# Patient Record
Sex: Male | Born: 1970 | Race: Black or African American | Hispanic: No | Marital: Married | Smoking: Never smoker
Health system: Southern US, Community
[De-identification: ages and names within clinical notes are randomized; demographics above are authoritative.]

## PROBLEM LIST (undated history)

## (undated) DIAGNOSIS — I059 Rheumatic mitral valve disease, unspecified: Secondary | ICD-10-CM

## (undated) DIAGNOSIS — R011 Cardiac murmur, unspecified: Secondary | ICD-10-CM

## (undated) HISTORY — PX: MITRAL VALVE REPAIR: SHX2039

---

## 2007-04-02 ENCOUNTER — Ambulatory Visit: Payer: Self-pay | Admitting: Family Medicine

## 2007-10-09 ENCOUNTER — Ambulatory Visit: Payer: Self-pay | Admitting: Unknown Physician Specialty

## 2011-08-14 ENCOUNTER — Ambulatory Visit: Payer: Self-pay | Admitting: Internal Medicine

## 2014-11-15 DIAGNOSIS — I341 Nonrheumatic mitral (valve) prolapse: Secondary | ICD-10-CM | POA: Insufficient documentation

## 2014-11-18 DIAGNOSIS — E782 Mixed hyperlipidemia: Secondary | ICD-10-CM | POA: Insufficient documentation

## 2014-11-18 DIAGNOSIS — I340 Nonrheumatic mitral (valve) insufficiency: Secondary | ICD-10-CM | POA: Insufficient documentation

## 2015-03-01 ENCOUNTER — Encounter: Admission: RE | Payer: Self-pay | Source: Ambulatory Visit

## 2015-03-01 ENCOUNTER — Ambulatory Visit
Admission: RE | Admit: 2015-03-01 | Payer: BC Managed Care – PPO | Source: Ambulatory Visit | Admitting: Internal Medicine

## 2015-03-01 SURGERY — RIGHT/LEFT HEART CATH AND CORONARY ANGIOGRAPHY
Anesthesia: Moderate Sedation | Laterality: Bilateral

## 2015-03-01 SURGERY — ECHOCARDIOGRAM, TRANSESOPHAGEAL
Anesthesia: Moderate Sedation

## 2015-03-22 ENCOUNTER — Ambulatory Visit
Admission: RE | Admit: 2015-03-22 | Discharge: 2015-03-22 | Disposition: A | Discharge status: Home / Self Care | Payer: BC Managed Care – PPO | Source: Ambulatory Visit | Attending: Internal Medicine | Admitting: Internal Medicine

## 2015-03-22 ENCOUNTER — Encounter
Admission: RE | Disposition: A | Discharge status: Home / Self Care | Payer: Self-pay | Source: Ambulatory Visit | Attending: Internal Medicine

## 2015-03-22 ENCOUNTER — Encounter: Payer: Self-pay | Admitting: *Deleted

## 2015-03-22 DIAGNOSIS — I517 Cardiomegaly: Secondary | ICD-10-CM | POA: Insufficient documentation

## 2015-03-22 DIAGNOSIS — I340 Nonrheumatic mitral (valve) insufficiency: Secondary | ICD-10-CM | POA: Diagnosis present

## 2015-03-22 DIAGNOSIS — Z91011 Allergy to milk products: Secondary | ICD-10-CM | POA: Diagnosis not present

## 2015-03-22 DIAGNOSIS — I341 Nonrheumatic mitral (valve) prolapse: Secondary | ICD-10-CM | POA: Diagnosis not present

## 2015-03-22 HISTORY — DX: Cardiac murmur, unspecified: R01.1

## 2015-03-22 HISTORY — PX: CARDIAC CATHETERIZATION: SHX172

## 2015-03-22 HISTORY — PX: TEE WITHOUT CARDIOVERSION: SHX5443

## 2015-03-22 SURGERY — ECHOCARDIOGRAM, TRANSESOPHAGEAL
Anesthesia: Moderate Sedation

## 2015-03-22 SURGERY — RIGHT/LEFT HEART CATH AND CORONARY ANGIOGRAPHY
Anesthesia: Moderate Sedation

## 2015-03-22 MED ORDER — SODIUM CHLORIDE 0.9% FLUSH: 3.0000 mL | Freq: Two times a day (BID) | INTRAVENOUS | Status: DC

## 2015-03-22 MED ORDER — MIDAZOLAM HCL 5 MG/5ML IJ SOLN
INTRAMUSCULAR | Status: AC
  Administered 2015-03-22: 08:00:00
  Filled 2015-03-22: qty 5

## 2015-03-22 MED ORDER — BUTAMBEN-TETRACAINE-BENZOCAINE 2-2-14 % EX AERO
INHALATION_SPRAY | CUTANEOUS | Status: AC
  Filled 2015-03-22: qty 20

## 2015-03-22 MED ORDER — SODIUM CHLORIDE 0.9 % IV SOLN
INTRAVENOUS | Status: DC
  Administered 2015-03-22: 08:00:00 via INTRAVENOUS

## 2015-03-22 MED ORDER — ACETAMINOPHEN 325 MG PO TABS: 650.0000 mg | ORAL_TABLET | ORAL | Status: DC | PRN

## 2015-03-22 MED ORDER — ONDANSETRON HCL 4 MG/2ML IJ SOLN: 4.0000 mg | Freq: Four times a day (QID) | INTRAMUSCULAR | Status: DC | PRN

## 2015-03-22 MED ORDER — SODIUM CHLORIDE 0.9 % IV SOLN: 250.0000 mL | INTRAVENOUS | Status: DC | PRN

## 2015-03-22 MED ORDER — SODIUM CHLORIDE 0.9% FLUSH: 3.0000 mL | INTRAVENOUS | Status: DC | PRN

## 2015-03-22 MED ORDER — MIDAZOLAM HCL 2 MG/2ML IJ SOLN
INTRAMUSCULAR | Status: AC
  Administered 2015-03-22: 08:00:00
  Filled 2015-03-22: qty 2

## 2015-03-22 MED ORDER — SODIUM CHLORIDE FLUSH 0.9 % IV SOLN
INTRAVENOUS | Status: AC
  Filled 2015-03-22: qty 10

## 2015-03-22 MED ORDER — FENTANYL CITRATE (PF) 100 MCG/2ML IJ SOLN
INTRAMUSCULAR | Status: AC
  Administered 2015-03-22: 50 ug
  Filled 2015-03-22: qty 4

## 2015-03-22 MED ORDER — SODIUM CHLORIDE 0.9 % WEIGHT BASED INFUSION: 3.0000 mL/kg/h | INTRAVENOUS | Status: DC

## 2015-03-22 MED ORDER — IOHEXOL 300 MG/ML  SOLN
INTRAMUSCULAR | Status: DC | PRN
  Administered 2015-03-22: 130 mL via INTRA_ARTERIAL

## 2015-03-22 SURGICAL SUPPLY — 14 items
CATH INFINITI 5FR ANG PIGTAIL (CATHETERS) ×3 IMPLANT
CATH INFINITI 5FR JL4 (CATHETERS) ×3 IMPLANT
CATH INFINITI JR4 5F (CATHETERS) ×3 IMPLANT
CATH SWANZ 7F THERMO (CATHETERS) ×3 IMPLANT
DEVICE CLOSURE MYNXGRIP 5F (Vascular Products) ×2 IMPLANT
GUIDEWIRE EMER 3M J .025X150CM (WIRE) ×2 IMPLANT
KIT MANI 3VAL PERCEP (MISCELLANEOUS) ×3 IMPLANT
KIT RIGHT HEART (MISCELLANEOUS) ×3 IMPLANT
NDL PERC 18GX7CM (NEEDLE) ×1 IMPLANT
NEEDLE PERC 18GX7CM (NEEDLE) ×3 IMPLANT
PACK CARDIAC CATH (CUSTOM PROCEDURE TRAY) ×3 IMPLANT
SHEATH PINNACLE 5F 10CM (SHEATH) ×3 IMPLANT
SHEATH PINNACLE 7F 10CM (SHEATH) ×3 IMPLANT
WIRE EMERALD 3MM-J .035X150CM (WIRE) ×3 IMPLANT

## 2015-03-22 NOTE — Discharge Instructions (Signed)

## 2015-03-22 NOTE — Progress Notes (Signed)
*  PRELIMINARY RESULTS* Echocardiogram Echocardiogram Transesophageal has been performed.  Laqueta Jean Hege 03/22/2015, 8:46 AM

## 2015-07-10 DIAGNOSIS — Z9889 Other specified postprocedural states: Secondary | ICD-10-CM | POA: Insufficient documentation

## 2015-07-11 DIAGNOSIS — I440 Atrioventricular block, first degree: Secondary | ICD-10-CM | POA: Insufficient documentation

## 2015-08-27 ENCOUNTER — Encounter: Payer: Self-pay | Admitting: Emergency Medicine

## 2015-08-27 ENCOUNTER — Emergency Department
Admission: EM | Admit: 2015-08-27 | Discharge: 2015-08-27 | Disposition: A | Discharge status: Home / Self Care | Payer: BC Managed Care – PPO | Attending: Emergency Medicine | Admitting: Emergency Medicine

## 2015-08-27 ENCOUNTER — Emergency Department: Payer: BC Managed Care – PPO

## 2015-08-27 DIAGNOSIS — R0789 Other chest pain: Secondary | ICD-10-CM | POA: Diagnosis not present

## 2015-08-27 DIAGNOSIS — Z7982 Long term (current) use of aspirin: Secondary | ICD-10-CM | POA: Diagnosis not present

## 2015-08-27 DIAGNOSIS — R079 Chest pain, unspecified: Secondary | ICD-10-CM

## 2015-08-27 DIAGNOSIS — R0602 Shortness of breath: Secondary | ICD-10-CM | POA: Diagnosis present

## 2015-08-27 LAB — TROPONIN I: Troponin I: 0.03 ng/mL (ref <0.03)

## 2015-08-27 LAB — CBC
HEMATOCRIT: 40.5 % (ref 40.0–52.0)
HEMOGLOBIN: 13.5 g/dL (ref 13.0–18.0)
MCH: 28.6 pg (ref 26.0–34.0)
MCHC: 33.4 g/dL (ref 32.0–36.0)
MCV: 85.6 fL (ref 80.0–100.0)
Platelets: 203 10*3/uL (ref 150–440)
RBC: 4.73 MIL/uL (ref 4.40–5.90)
RDW: 14.9 % — ABNORMAL HIGH (ref 11.5–14.5)
WBC: 6.9 10*3/uL (ref 3.8–10.6)

## 2015-08-27 LAB — FIBRIN DERIVATIVES D-DIMER (ARMC ONLY): Fibrin derivatives D-dimer (ARMC): 5860 — ABNORMAL HIGH (ref 0–499)

## 2015-08-27 LAB — BASIC METABOLIC PANEL
ANION GAP: 8 (ref 5–15)
BUN: 14 mg/dL (ref 6–20)
CO2: 23 mmol/L (ref 22–32)
Calcium: 8.9 mg/dL (ref 8.9–10.3)
Chloride: 106 mmol/L (ref 101–111)
Creatinine, Ser: 1.21 mg/dL (ref 0.61–1.24)
GFR calc Af Amer: >60 mL/min (ref >60)
Glucose, Bld: 102 mg/dL — ABNORMAL HIGH (ref 65–99)
POTASSIUM: 3.8 mmol/L (ref 3.5–5.1)
SODIUM: 137 mmol/L (ref 135–145)

## 2015-08-27 LAB — BRAIN NATRIURETIC PEPTIDE: B Natriuretic Peptide: 58 pg/mL (ref 0.0–100.0)

## 2015-08-27 MED ORDER — IOPAMIDOL (ISOVUE-370) INJECTION 76%
75.0000 mL | Freq: Once | INTRAVENOUS | Status: AC | PRN
Start: 2015-08-27 — End: 2015-08-27
  Administered 2015-08-27: 75 mL via INTRAVENOUS

## 2015-08-27 MED ORDER — HYDROCODONE-ACETAMINOPHEN 5-325 MG PO TABS: 1.0000 | ORAL_TABLET | Freq: Four times a day (QID) | ORAL | 0 refills | Status: DC | PRN

## 2015-08-27 MED ORDER — AMOXICILLIN 500 MG PO CAPS: 500.0000 mg | ORAL_CAPSULE | Freq: Three times a day (TID) | ORAL | 0 refills | Status: AC

## 2015-08-27 NOTE — Discharge Instructions (Signed)
Please return immediately if condition worsens. Please contact her primary physician or the physician you were given for referral. If you have any specialist physicians involved in her treatment and plan please also contact them. Thank you for using [facility]. Please take over-the-counter ibuprofen and/or Tylenol for pain. Pain may be musculoskeletal in nature or from the pericardial fluid and also possibly the postoperative fluid it's most likely sitting at the bases of your lungs. Please contact both your cardiologist and the cardiothoracic surgeon for outpatient follow-up for today's presentation. I felt we did not need to necessarily do inpatient management ,but may need a echocardiogram etc.

## 2015-08-27 NOTE — ED Provider Notes (Signed)
Time Seen: Approximately 1730  I have reviewed the triage notes  Chief Complaint: Shortness of Breath and Chest Pain   History of Present Illness: Ricky Ross is a 45 y.o. male who said mitral valve repair approximately a month and a half ago. Patient states that he had an echocardiogram, etc. prior to his surgery. He's also had previous heart catheterization which didn't show any coronary artery disease. The patient presents with a 24-hour history of some left-sided upper chest pain which she states can get worse whenever he lies flat. He denies any true shortness of breath though he states it will hurt when he takes a deep breath and he feels heart palpitations like his breathing is being caught in the abdominal area. He denies much in way of flank or back discomfort. He denies any peripheral edema, calf tenderness or swelling. Patient states he did well postoperatively denies any fever, chills or productive cough or wheezing   Past Medical History:  Diagnosis Date  . Heart murmur     Patient Active Problem List   Diagnosis Date Noted  . Mitral valve prolapse, nonrheumatic 03/22/2015    Past Surgical History:  Procedure Laterality Date  . CARDIAC CATHETERIZATION N/A 03/22/2015   Procedure: Right/Left Heart Cath and Coronary Angiography;  Surgeon: Corey Skains, MD;  Location: [facility];  Service: Cardiovascular;  Laterality: N/A;  . MITRAL VALVE REPAIR    . TEE WITHOUT CARDIOVERSION N/A 03/22/2015   Procedure: TRANSESOPHAGEAL ECHOCARDIOGRAM (TEE);  Surgeon: Corey Skains, MD;  Location: [facility];  Service: Cardiovascular;  Laterality: N/A;    Past Surgical History:  Procedure Laterality Date  . CARDIAC CATHETERIZATION N/A 03/22/2015   Procedure: Right/Left Heart Cath and Coronary Angiography;  Surgeon: Corey Skains, MD;  Location: [facility];  Service: Cardiovascular;  Laterality: N/A;  . MITRAL VALVE REPAIR    . TEE WITHOUT CARDIOVERSION N/A  03/22/2015   Procedure: TRANSESOPHAGEAL ECHOCARDIOGRAM (TEE);  Surgeon: Corey Skains, MD;  Location: [facility];  Service: Cardiovascular;  Laterality: N/A;    Current Outpatient Rx  . Order #: [id number] Class: Historical Med  . Order #: [id number] Class: Print  . Order #: [id number] Class: Print    Allergies:  Milk-related compounds  Family History: No family history on file.  Social History: Social History  Substance Use Topics  . Smoking status: Never Smoker  . Smokeless tobacco: Not on file  . Alcohol use No     Review of Systems:   10 point review of systems was performed and was otherwise negative:  Constitutional: No fever Eyes: No visual disturbances ENT: No sore throat, ear pain Cardiac: Chest pain is very transient almost sharp and description. He denies any neck discomfort. He denies any diaphoresis, nausea, vomiting Respiratory: No shortness of breath, wheezing, or stridor Abdomen: No abdominal pain, no vomiting, No diarrhea Endocrine: No weight loss, No night sweats Extremities: No peripheral edema, cyanosis Skin: No rashes, easy bruising Neurologic: No focal weakness, trouble with speech or swollowing Urologic: No dysuria, Hematuria, or urinary frequency   Physical Exam:  ED Triage Vitals  Enc Vitals Group     BP 08/27/15 1652 113/73     Pulse Rate 08/27/15 1652 91     Resp 08/27/15 1652 18     Temp 08/27/15 1652 98.5 F (36.9 C)     Temp Source 08/27/15 1652 Oral     SpO2 08/27/15 1652 100 %     Weight 08/27/15 1652 198 lb (89.8  kg)     Height 08/27/15 1652 5\' 10"  (1.778 m)     Head Circumference --      Peak Flow --      Pain Score 08/27/15 1653 7     Pain Loc --      Pain Edu? --      Excl. in [place]? --     General: Awake , Alert , and Oriented times 3; GCS 15 Head: Normal cephalic , atraumatic Eyes: Pupils equal , round, reactive to light Nose/Throat: No nasal drainage, patent upper airway without erythema or exudate.  Neck: Supple, Full  range of motion, No anterior adenopathy or palpable thyroid masses Lungs: Clear to ascultation without wheezes , rhonchi, or rales Heart: Regular rate, regular rhythm without murmurs , gallops , or rubs Abdomen: Soft, non tender without rebound, guarding , or rigidity; bowel sounds positive and symmetric in all 4 quadrants. No organomegaly .        Extremities: 2 plus symmetric pulses. No edema, clubbing or cyanosis Neurologic: normal ambulation, Motor symmetric without deficits, sensory intact Skin: warm, dry, no rashes Mild reproducible component with palpation across the sternal region. No crepitus or step-off noted  Labs:   All laboratory work was reviewed including any pertinent negatives or positives listed below:  Labs Reviewed  Joice - Abnormal; Notable for the following:       Result Value   Glucose, Bld 102 (*)    All other components within normal limits  CBC - Abnormal; Notable for the following:    RDW 14.9 (*)    All other components within normal limits  TROPONIN I - Abnormal; Notable for the following:    Troponin I 0.03 (*)    All other components within normal limits  FIBRIN DERIVATIVES D-DIMER ([facility] ONLY) - Abnormal; Notable for the following:    Fibrin derivatives D-dimer [facility]) 5,860 (*)    All other components within normal limits  BRAIN NATRIURETIC PEPTIDE  Laboratory work showed an elevated D-dimer test.  EKG:  ED ECG REPORT I, Daymon Larsen, the attending physician, personally viewed and interpreted this ECG.  Date: 08/27/2015 EKG Time: 1651 Rate: *92 Rhythm: normal sinus rhythm QRS Axis: normal Intervals: normal ST/T Wave abnormalities: Nonspecific T wave abnormality Conduction Disturbances: none Narrative Interpretation: unremarkable No acute ischemic changes, no obvious findings of pericarditis    Radiology:CLINICAL DATA:  Chest pain and shortness of breath. Pain in left chest. Mitral valve repair 1 month ago. EXAM: CHEST   2 VIEW COMPARISON:  None. FINDINGS: The heart size is upper limits of normal. Mitral valve annular repair is noted. Bilateral pleural effusions are present. Moderate pulmonary vascular congestion is noted. Surgical clips are noted at the right hilum in the anterior right chest. The visualized soft tissues and bony thorax are otherwise unremarkable. IMPRESSION: 1. Bilateral pleural effusions and basilar airspace disease likely reflect atelectasis. This may reflect resolving effusions from recent open heart surgery. If these are recurrent, they may indicate congestive heart disease. It is impossible to snow without comparison films. 2. Postoperative changes in the right chest and mitral valve annular ring. Electronically Signed   By: San Morelle M.D.   On: 08/27/2015 17:35  EXAM: CT ANGIOGRAPHY CHEST WITH CONTRAST TECHNIQUE: Multidetector CT imaging of the chest was performed using the standard protocol during bolus administration of intravenous contrast. Multiplanar CT image reconstructions and MIPs were obtained to evaluate the vascular anatomy. CONTRAST:  75 cc Isovue 370 COMPARISON:  Chest radiograph  dated 08/27/2015 FINDINGS: There is a moderate right and small left pleural effusion. There is associated partial compressive atelectasis of the lung bases. Small amount of fluid is noted in the right minor fissure. There is subsegmental atelectatic changes of the right middle lobe. Pneumonia is not excluded. The central airways are patent. There is no pneumothorax. The thoracic aorta appears unremarkable. The origins of the great vessels of the aortic arch appear patent. There is dilatation of the main pulmonary trunk indicative of underlying pulmonary hypertension. Evaluation of the pulmonary arteries is limited due to suboptimal opacification of the peripheral branches. No central pulmonary artery embolus identified. There is moderate cardiomegaly and small  pericardial effusion. Mechanical mitral valve. There is diffuse stranding of the anterior mediastinal fat. No fluid collection. Multiple surgical clips noted in the anterior mediastinum extending into the anterior chest wall along the right side of the sternum. There is no mediastinal or hilar adenopathy. The esophagus is grossly unremarkable. There is no axillary adenopathy. There is subcutaneous haziness of the right anterior chest wall with multiple surgical clips, likely postsurgical changes. Correlation with history of surgery recommended. The osseous structures are intact. The visualized upper abdomen appear unremarkable. Review of the MIP images confirms the above findings. IMPRESSION: No central pulmonary artery embolus. Moderate right and small left pleural effusion with associated partial compressive atelectasis of the lower lobes. There is also subsegmental atelectatic changes of the right middle lobe. Pneumonia is not excluded. Moderate cardiomegaly with small pericardial effusion and small amount of fluid in the anterior mediastinum, likely postsurgical. Correlation with surgical history recommended. No drainable fluid collection or abscess or hematoma identified. Electronically Signed   By: Anner Crete M.D.   On: 08/27/2015 21:41 I personally reviewed the radiologic studies    ED Course:  Patient's stay here was uneventful and he declined on any pain medication while here in emergency department. It is difficult to ascertain the exact cause of his chest discomfort this time. He has some postoperative findings on EKG with what appears to be some left atrial enlargement and nonspecific ST-T wave abnormality. His chest CT did not show any evidence of significant. Cardial fluid or evidence of pulmonary embolism. Patient's pain seems to be very atypical in nature though still has residual small amount of pericardial fluid along with bilateral pleural effusions. I'm not  sure if these are normal postoperative findings as far out from his cardiothoracic surgery was some new process. Does not use but appear to be pulmonary edema and again he has no evidence of cardiac tamponade not etc. Patient was advised follow-up with both his cardiologist and/or his cardiothoracic surgeon. Because the radiologist couldn't rule out an infectious cause of place the patient on amoxicillin which I felt was a benign treatment plan and what appears to be clinically not pneumonia    Clinical Course     Assessment:  Acute unspecified chest pain Increasing cardiothoracic surgery on the mitral valve   Final Clinical Impression  Final diagnoses:  Chest pain  Chest pain, unspecified chest pain type     Plan: * Outpatient Prescription for Norco and amoxicillin Patient was advised to return immediately if condition worsens. Patient was advised to follow up with their primary care physician or other specialized physicians involved in their outpatient care. The patient and/or family member/power of attorney had laboratory results reviewed at the bedside. All questions and concerns were addressed and appropriate discharge instructions were distributed by the nursing staff.  Daymon Larsen, MD 08/27/15 [phone]

## 2015-08-27 NOTE — ED Triage Notes (Signed)
Pt states he became [facility] this morning after using his incentive spirometer this morning. Pt states he is unable to take a deep breath without pain. C/o pain to L chest under his ribs. Pt states hx mitral valve repair back in June. Pt also c/o constant back pain.

## 2015-08-27 NOTE — ED Notes (Signed)
Discharge instructions reviewed with patient. Patient verbalized understanding. Patient ambulated to lobby without difficulty.   

## 2015-08-27 NOTE — ED Notes (Signed)
Troponin 0.03. Md notified.

## 2015-09-05 ENCOUNTER — Emergency Department
Admission: EM | Admit: 2015-09-05 | Discharge: 2015-09-05 | Disposition: A | Discharge status: Home / Self Care | Payer: BC Managed Care – PPO | Attending: Emergency Medicine | Admitting: Emergency Medicine

## 2015-09-05 ENCOUNTER — Emergency Department: Payer: BC Managed Care – PPO

## 2015-09-05 DIAGNOSIS — Z7982 Long term (current) use of aspirin: Secondary | ICD-10-CM | POA: Diagnosis not present

## 2015-09-05 DIAGNOSIS — M7989 Other specified soft tissue disorders: Secondary | ICD-10-CM

## 2015-09-05 DIAGNOSIS — M79601 Pain in right arm: Secondary | ICD-10-CM | POA: Diagnosis present

## 2015-09-05 DIAGNOSIS — R600 Localized edema: Secondary | ICD-10-CM

## 2015-09-05 HISTORY — DX: Rheumatic mitral valve disease, unspecified: I05.9

## 2015-09-05 MED ORDER — OXYCODONE-ACETAMINOPHEN 5-325 MG PO TABS: 1.0000 | ORAL_TABLET | Freq: Four times a day (QID) | ORAL | 0 refills | Status: DC | PRN

## 2015-09-05 NOTE — Discharge Instructions (Signed)
Wear sling and take medication as directed.

## 2015-09-05 NOTE — ED Provider Notes (Signed)
[facility] Emergency Department Provider Note    ____________________________________________   First MD Initiated Contact with Patient 09/05/15 1028     (approximate)  I have reviewed the triage vital signs and the nursing notes.   HISTORY  Chief Complaint Arm Pain    HPI Ricky Ross is a 45 y.o. male patient here with right forearm pain and limited motion of the right arm secondary to infiltration IV contrast. Patient had a procedure on this July 30 which had ultrasound-guided IV. Patient stated he felt leaking and IV contrast was told to the IV was placed in an artery instead of a vein. Patient denies any redness to the affected extremity. Patient rated his pain as a 1/10.   Past Medical History:  Diagnosis Date  . Heart murmur   . Mitral valve disorder     Patient Active Problem List   Diagnosis Date Noted  . Mitral valve prolapse, nonrheumatic 03/22/2015    Past Surgical History:  Procedure Laterality Date  . CARDIAC CATHETERIZATION N/A 03/22/2015   Procedure: Right/Left Heart Cath and Coronary Angiography;  Surgeon: Corey Skains, MD;  Location: [place] [facility];  Service: Cardiovascular;  Laterality: N/A;  . MITRAL VALVE REPAIR    . TEE WITHOUT CARDIOVERSION N/A 03/22/2015   Procedure: TRANSESOPHAGEAL ECHOCARDIOGRAM (TEE);  Surgeon: Corey Skains, MD;  Location: [facility];  Service: Cardiovascular;  Laterality: N/A;    Prior to Admission medications   Medication Sig Start Date End Date Taking? Authorizing Provider  amoxicillin (AMOXIL) 500 MG capsule Take 1 capsule (500 mg total) by mouth 3 (three) times daily. 08/27/15 09/06/15  Daymon Larsen, MD  aspirin 81 MG EC tablet Take 81 mg by mouth daily.    Historical Provider, MD  HYDROcodone-acetaminophen (NORCO) 5-325 MG tablet Take 1 tablet by mouth every 6 (six) hours as needed for moderate pain. 08/27/15   Daymon Larsen, MD  oxyCODONE-acetaminophen (ROXICET) 5-325 MG tablet Take  1 tablet by mouth every 6 (six) hours as needed. 09/05/15 09/04/16  Sable Feil, PA-C    Allergies Milk-related compounds  No family history on file.  Social History Social History  Substance Use Topics  . Smoking status: Never Smoker  . Smokeless tobacco: Never Used  . Alcohol use No    Review of Systems Constitutional: No fever/chills Eyes: No visual changes. ENT: No sore throat. Cardiovascular: Denies chest pain. Respiratory: Denies shortness of breath. Gastrointestinal: No abdominal pain.  No nausea, no vomiting.  No diarrhea.  No constipation. Genitourinary: Negative for dysuria. Musculoskeletal: Right forearm pain Skin: Negative for rash. Right forearm edema Neurological: Negative for headaches, focal weakness or numbness.    ____________________________________________   PHYSICAL EXAM:  VITAL SIGNS: ED Triage Vitals  Enc Vitals Group     BP 09/05/15 0847 121/79     Pulse Rate 09/05/15 0847 91     Resp 09/05/15 0847 17     Temp 09/05/15 0847 97.8 F (36.6 C)     Temp src --      SpO2 09/05/15 0847 99 %     Weight 09/05/15 0847 200 lb (90.7 kg)     Height 09/05/15 0847 5\' 9"  (1.753 m)     Head Circumference --      Peak Flow --      Pain Score 09/05/15 0848 1     Pain Loc --      Pain Edu? --      Excl. in [place]? --  Constitutional: Alert and oriented. Well appearing and in no acute distress. Eyes: Conjunctivae are normal. PERRL. EOMI. Head: Atraumatic. Nose: No congestion/rhinnorhea. Mouth/Throat: Mucous membranes are moist.  Oropharynx non-erythematous. Neck: No stridor.  No cervical spine tenderness to palpation. Hematological/Lymphatic/Immunilogical: No cervical lymphadenopathy. Cardiovascular: Normal rate, regular rhythm. Grossly normal heart sounds.  Good peripheral circulation. Respiratory: Normal respiratory effort.  No retractions. Lungs CTAB. Gastrointestinal: Soft and nontender. No distention. No abdominal bruits. No CVA  tenderness. Musculoskeletal: No obvious deformity to the right upper extremity. There is some mild edema. Patient has full nuchal range of motion to complain of pain. Neurovascular intact.  Neurologic:  Normal speech and language. No gross focal neurologic deficits are appreciated. No gait instability. Skin:  Skin is warm, dry and intact. No rash noted. Psychiatric: Mood and affect are normal. Speech and behavior are normal.  ____________________________________________   LABS (all labs ordered are listed, but only abnormal results are displayed)  Labs Reviewed - No data to display ____________________________________________  EKG   ____________________________________________  RADIOLOGY ultrasound was negative for cellulitis, hematoma, or DVT. ____________________________________________   PROCEDURES  Procedure(s) performed: None  Procedures  Critical Care performed: No  ____________________________________________   INITIAL IMPRESSION / ASSESSMENT AND PLAN / ED COURSE  Pertinent labs & imaging results that were available during my care of the patient were reviewed by me and considered in my medical decision making (see chart for details).  Right forearm edema. Patient advised on negative findings of ultrasound the right upper extremity. Patient given discharge care instructions. Patient advised to wear sling to facilitate resolution of the edema. Patient advised to follow-up family doctor. Patient given a work note.  Clinical Course     ____________________________________________   FINAL CLINICAL IMPRESSION(S) / ED DIAGNOSES  Final diagnoses:  Right arm pain      NEW MEDICATIONS STARTED DURING THIS VISIT:  New Prescriptions   OXYCODONE-ACETAMINOPHEN (ROXICET) 5-325 MG TABLET    Take 1 tablet by mouth every 6 (six) hours as needed.     Note:  This document was prepared using Dragon voice recognition software and may include unintentional dictation  errors.    Sable Feil, PA-C 09/05/15 1227    Rudene Re, MD 09/05/15 [phone]

## 2015-09-05 NOTE — ED Notes (Signed)
See triage note  Was seen here on 07/30 ..states he had an ultrasound guided IV placed in right arm for chest ct  States during the scan  He felt leaking of the iv . Stopped the scan   Was told that the IV was placed in artery instead  conts to have pain with limited movem\ent of right arm

## 2015-09-05 NOTE — ED Triage Notes (Signed)
Pt was here 7/30 and had a Ct with iv contrast, states the IV site infiltrated in there right FA and is here with c/o pain with swelling to the the site.. No noted redness.. Some bruising noted.Marland Kitchen

## 2016-01-31 DIAGNOSIS — I872 Venous insufficiency (chronic) (peripheral): Secondary | ICD-10-CM | POA: Insufficient documentation

## 2016-03-07 ENCOUNTER — Other Ambulatory Visit (INDEPENDENT_AMBULATORY_CARE_PROVIDER_SITE_OTHER): Payer: Self-pay | Admitting: Internal Medicine

## 2016-03-07 ENCOUNTER — Ambulatory Visit (INDEPENDENT_AMBULATORY_CARE_PROVIDER_SITE_OTHER): Payer: BC Managed Care – PPO | Admitting: Vascular Surgery

## 2016-03-07 ENCOUNTER — Other Ambulatory Visit (INDEPENDENT_AMBULATORY_CARE_PROVIDER_SITE_OTHER): Payer: BC Managed Care – PPO

## 2016-03-07 ENCOUNTER — Encounter (INDEPENDENT_AMBULATORY_CARE_PROVIDER_SITE_OTHER): Payer: Self-pay

## 2016-03-07 VITALS — BP 131/82 | HR 79 | Resp 16 | Ht 69.5 in | Wt 214.0 lb

## 2016-03-07 DIAGNOSIS — I341 Nonrheumatic mitral (valve) prolapse: Secondary | ICD-10-CM | POA: Diagnosis not present

## 2016-03-07 DIAGNOSIS — I890 Lymphedema, not elsewhere classified: Secondary | ICD-10-CM | POA: Insufficient documentation

## 2016-03-07 DIAGNOSIS — I872 Venous insufficiency (chronic) (peripheral): Secondary | ICD-10-CM

## 2016-03-07 NOTE — Progress Notes (Signed)
MRN : [id number]  Ricky Ross is a 46 y.o. ([birth year]) male who presents with chief complaint of  Chief Complaint  Patient presents with  . New Evaluation    Reflux Left leg  .  History of Present Illness: Patient is seen for evaluation of leg pain and leg swelling. The patient first noticed the swelling remotely. The swelling is associated with pain and discoloration. The pain and swelling worsens with prolonged dependency and improves with elevation. The pain is unrelated to activity.  The patient notes that in the morning the legs are significantly improved but they steadily worsened throughout the course of the day. The patient also notes a steady worsening of the discoloration in the ankle and shin area.   The patient denies claudication symptoms.  The patient denies symptoms consistent with rest pain.  The patient denies and extensive history of DJD and LS spine disease.  The patient has no had any past angiography, interventions or vascular surgery.  Elevation makes the leg symptoms better, dependency makes them much worse. There is no history of ulcerations. The patient denies any recent changes in medications.  The patient has not been wearing graduated compression.  The patient denies a history of DVT or PE. There is no prior history of phlebitis. There is no history of primary lymphedema.  No history of malignancies. No history of trauma or groin or pelvic surgery. There is no history of radiation treatment to the groin or pelvis  The patient denies amaurosis fugax or recent TIA symptoms. There are no recent neurological changes noted. The patient denies recent episodes of angina or shortness of breath  Current Meds  Medication Sig  . aspirin 81 MG EC tablet Take 81 mg by mouth daily.  . Multiple Vitamin (MULTI-VITAMINS) TABS Take by mouth.    Past Medical History:  Diagnosis Date  . Heart murmur   . Mitral valve disorder     Past Surgical History:  Procedure  Laterality Date  . CARDIAC CATHETERIZATION N/A 03/22/2015   Procedure: Right/Left Heart Cath and Coronary Angiography;  Surgeon: Corey Skains, MD;  Location: [facility];  Service: Cardiovascular;  Laterality: N/A;  . MITRAL VALVE REPAIR    . TEE WITHOUT CARDIOVERSION N/A 03/22/2015   Procedure: TRANSESOPHAGEAL ECHOCARDIOGRAM (TEE);  Surgeon: Corey Skains, MD;  Location: [facility];  Service: Cardiovascular;  Laterality: N/A;    Social History Social History  Substance Use Topics  . Smoking status: Never Smoker  . Smokeless tobacco: Never Used  . Alcohol use No    Family History No family history on file. No family history of bleeding/clotting disorders, porphyria or autoimmune disease   Allergies  Allergen Reactions  . Milk-Related Compounds Hives     REVIEW OF SYSTEMS (Negative unless checked)  Constitutional: [] Weight loss  [] Fever  [] Chills Cardiac: [] Chest pain   [] Chest pressure   [] Palpitations   [] Shortness of breath when laying flat   [] Shortness of breath with exertion. Vascular:  [] Pain in legs with walking   [] Pain in legs at rest  [] History of DVT   [] Phlebitis   [x] Swelling in legs   [x] Varicose veins   [] Non-healing ulcers Pulmonary:   [] Uses home oxygen   [] Productive cough   [] Hemoptysis   [] Wheeze  [] COPD   [] Asthma Neurologic:  [] Dizziness   [] Seizures   [] History of stroke   [] History of TIA  [] Aphasia   [] Vissual changes   [] Weakness or numbness in arm   [] Weakness or  numbness in leg Musculoskeletal:   [] Joint swelling   [x] Joint pain   [] Low back pain Hematologic:  [] Easy bruising  [] Easy bleeding   [] Hypercoagulable state   [] Anemic Gastrointestinal:  [] Diarrhea   [] Vomiting  [] Gastroesophageal reflux/heartburn   [] Difficulty swallowing. Genitourinary:  [] Chronic kidney disease   [] Difficult urination  [] Frequent urination   [] Blood in urine Skin:  [] Rashes   [] Ulcers  Psychological:  [] History of anxiety   []  History of major  depression.  Physical Examination  Vitals:   03/07/16 1440  BP: 131/82  Pulse: 79  Resp: 16  Weight: 214 lb (97.1 kg)  Height: 5' 9.5" (1.765 m)   Body mass index is 31.15 kg/m. Gen: WD/WN, NAD Head: [place]/AT, No temporalis wasting.  Ear/Nose/Throat: Hearing grossly intact, nares w/o erythema or drainage, poor dentition Eyes: PER, EOMI, sclera nonicteric.  Neck: Supple, no masses.  No bruit or JVD.  Pulmonary:  Good air movement, clear to auscultation bilaterally, no use of accessory muscles.  Cardiac: RRR, normal S1, S2, no Murmurs. Vascular:  Edema of the left ankle 2+ Vessel Right Left  Radial Palpable Palpable  Ulnar Palpable Palpable  Brachial Palpable Palpable  Carotid Palpable Palpable  Femoral Palpable Palpable  Popliteal Palpable Palpable  PT Palpable Palpable  DP Palpable Palpable   Gastrointestinal: soft, non-distended. No guarding/no peritoneal signs.  Musculoskeletal: M/S 5/5 throughout.  No deformity or atrophy.  Neurologic: CN 2-12 intact. Pain and light touch intact in extremities.  Symmetrical.  Speech is fluent. Motor exam as listed above. Psychiatric: Judgment intact, Mood & affect appropriate for pt's clinical situation. Dermatologic: No rashes or ulcers noted.  No changes consistent with cellulitis. Lymph : No Cervical lymphadenopathy, no lichenification or skin changes of chronic lymphedema.  CBC Lab Results  Component Value Date   WBC 6.9 08/27/2015   HGB 13.5 08/27/2015   HCT 40.5 08/27/2015   MCV 85.6 08/27/2015   PLT 203 08/27/2015    BMET    Component Value Date/Time   NA 137 08/27/2015 1656   K 3.8 08/27/2015 1656   CL 106 08/27/2015 1656   CO2 23 08/27/2015 1656   GLUCOSE 102 (H) 08/27/2015 1656   BUN 14 08/27/2015 1656   CREATININE 1.21 08/27/2015 1656   CALCIUM 8.9 08/27/2015 1656   GFRNONAA >60 08/27/2015 1656   GFRAA >60 08/27/2015 1656   CrCl cannot be calculated (Patient's most recent lab result is older than the maximum  21 days allowed.).  COAG No results found for: INR, PROTIME  Radiology No results found.  Assessment/Plan 1. Chronic venous insufficiency No surgery or intervention at this point in time.    I have had a long discussion with the patient regarding venous insufficiency and why it  causes symptoms. I have discussed with the patient the chronic skin changes that accompany venous insufficiency and the long term sequela such as infection and ulceration.  Patient will begin wearing graduated compression stockings class 1 (20-30 mmHg) or compression wraps on a daily basis a prescription was given. The patient will put the stockings on first thing in the morning and removing them in the evening. The patient is instructed specifically not to sleep in the stockings.    In addition, behavioral modification including several periods of elevation of the lower extremities during the day will be continued. I have demonstrated that proper elevation is a position with the ankles at heart level.  The patient is instructed to begin routine exercise, especially walking on a daily basis  Patient's duplex ultrasound of the venous system shows no evidence of  DVT;  reflux is present in the left common femoral vein.  Following the review of the ultrasound the patient will follow up in 12 months to reassess the degree of swelling and the control that graduated compression stockings or compression wraps  is offering.   The patient can be assessed for a Lymph Pump at that time  2. Lymphedema See #1  3. Mitral valve prolapse, nonrheumatic Cardiology is following    Hortencia Pilar, MD  03/07/2016 3:56 PM

## 2016-11-26 DIAGNOSIS — I300 Acute nonspecific idiopathic pericarditis: Secondary | ICD-10-CM | POA: Insufficient documentation

## 2016-11-27 ENCOUNTER — Ambulatory Visit: Payer: BC Managed Care – PPO | Admitting: Gastroenterology

## 2016-12-24 ENCOUNTER — Ambulatory Visit: Payer: BC Managed Care – PPO | Admitting: Gastroenterology

## 2017-01-10 ENCOUNTER — Encounter: Payer: Self-pay | Admitting: Internal Medicine

## 2017-01-10 ENCOUNTER — Ambulatory Visit (INDEPENDENT_AMBULATORY_CARE_PROVIDER_SITE_OTHER): Payer: BC Managed Care – PPO | Admitting: Internal Medicine

## 2017-01-10 VITALS — BP 120/75 | HR 80 | Resp 16 | Ht 69.0 in | Wt 221.0 lb

## 2017-01-10 DIAGNOSIS — J3089 Other allergic rhinitis: Secondary | ICD-10-CM

## 2017-01-10 DIAGNOSIS — R05 Cough: Secondary | ICD-10-CM | POA: Diagnosis not present

## 2017-01-10 DIAGNOSIS — I341 Nonrheumatic mitral (valve) prolapse: Secondary | ICD-10-CM

## 2017-01-10 DIAGNOSIS — G479 Sleep disorder, unspecified: Secondary | ICD-10-CM

## 2017-01-10 DIAGNOSIS — R059 Cough, unspecified: Secondary | ICD-10-CM

## 2017-02-03 NOTE — Progress Notes (Signed)
  [facility] [place], [place] [address]  Internal MEDICINE  Office Visit Note  Patient Name: Ricky Ross  [id number]  [id number]  Date of Service: 02/03/2017     Complaints/HPI:  Pt is here for a sick visit.  Cough  This is a new problem. The current episode started in the past 7 days. The problem has been gradually improving. The cough is non-productive. Associated symptoms include rhinorrhea and a sore throat. Pertinent negatives include no chills, ear congestion, ear pain or fever. Nothing aggravates the symptoms. He has tried OTC cough suppressant for the symptoms. The treatment provided mild relief.  Pt is concerned about sleep problems   Current Medication:  Outpatient Encounter Medications as of 01/10/2017  Medication Sig  . [EXPIRED] amoxicillin-clavulanate (AUGMENTIN) 875-125 MG tablet Take 1 tablet by mouth 2 (two) times daily.  Marland Kitchen aspirin 81 MG EC tablet Take 81 mg by mouth daily.  . Multiple Vitamin (MULTI-VITAMINS) TABS Take by mouth.   No facility-administered encounter medications on file as of 01/10/2017.       Medical History: Past Medical History:  Diagnosis Date  . Heart murmur   . Mitral valve disorder      Vital Signs: BP 120/75   Pulse 80   Resp 16   Ht 5\' 9"  (1.753 m)   Wt 221 lb (100.2 kg)   SpO2 98%   BMI 32.64 kg/m    Review of Systems  Constitutional: Negative for chills and fever.  HENT: Positive for rhinorrhea and sore throat. Negative for ear pain.   Respiratory: Positive for cough.     Physical Exam  Constitutional: He is oriented to person, place, and time. He appears well-developed and well-nourished. No distress.  HENT:  Head: Normocephalic and atraumatic.  Mouth/Throat: Oropharynx is clear and moist. No oropharyngeal exudate.  Eyes: EOM are normal. Pupils are equal, round, and reactive to light.  Neck: Normal range of motion. Neck supple. No JVD present. No tracheal deviation present. No  thyromegaly present.  Cardiovascular: Normal rate and regular rhythm. Exam reveals no gallop and no friction rub.  Murmur heard. Pulmonary/Chest: Effort normal. No respiratory distress. He has no wheezes. He has no rales. He exhibits no tenderness.  Abdominal: Soft. Bowel sounds are normal.  Musculoskeletal: Normal range of motion.  Lymphadenopathy:    He has no cervical adenopathy.  Neurological: He is alert and oriented to person, place, and time. No cranial nerve deficit.  Skin: Skin is warm and dry. He is not diaphoretic.  Psychiatric: He has a normal mood and affect. His behavior is normal. Judgment and thought content normal.      Assessment/Plan: 1. Non-seasonal allergic rhinitis due to other allergic trigger - OTC flonase prn, symptoms are resolving  2. Cough in adult - Monitor for now   3. Mitral valve prolapse, nonrheumatic S/p surgical intervention  4. Sleep disorder  - PSG SLEEP STUDY; Future  General Counseling : I have discussed the findings of the evaluation and examination with Ricky Ross.  I have also discussed any further diagnostic evaluation that may be needed or ordered today. Ricky Ross verbalizes understanding of the findings of todays visit. We also reviewed his medications today. he has been encouraged to call the office with any questions or concerns that should arise related to todays visit.    Counseling: Sleep hygiene is discussed with the pt      Time spent: 25 min

## 2017-03-10 ENCOUNTER — Ambulatory Visit (INDEPENDENT_AMBULATORY_CARE_PROVIDER_SITE_OTHER): Payer: BC Managed Care – PPO | Admitting: Vascular Surgery

## 2017-08-25 ENCOUNTER — Encounter: Payer: Self-pay | Admitting: Adult Health

## 2017-08-25 ENCOUNTER — Ambulatory Visit (INDEPENDENT_AMBULATORY_CARE_PROVIDER_SITE_OTHER): Payer: BC Managed Care – PPO | Admitting: Adult Health

## 2017-08-25 VITALS — BP 132/86 | HR 68 | Temp 98.3°F | Resp 16 | Ht 70.0 in | Wt 223.2 lb

## 2017-08-25 DIAGNOSIS — Z87898 Personal history of other specified conditions: Secondary | ICD-10-CM | POA: Diagnosis not present

## 2017-08-25 DIAGNOSIS — R0681 Apnea, not elsewhere classified: Secondary | ICD-10-CM | POA: Diagnosis not present

## 2017-08-25 DIAGNOSIS — I341 Nonrheumatic mitral (valve) prolapse: Secondary | ICD-10-CM | POA: Diagnosis not present

## 2017-08-25 DIAGNOSIS — G479 Sleep disorder, unspecified: Secondary | ICD-10-CM

## 2017-08-25 NOTE — Progress Notes (Signed)
[facility] [place], [place] [address]  Internal MEDICINE  Office Visit Note  Patient Name: Ricky Ross  [id number]  [id number]  Date of Service: 08/25/2017  Chief Complaint  Patient presents with  . Sleep Apnea    wife complaining about snoring     HPI Pt here reporting his wife has noticed excessive snoring at night with periods of apnea.  He denies excessive daytime sleepiness, however at times he reports feeling fatigued and tired even after sleeping. He exercises regularly, and works 5 days a week.  Denies history of smoking.        Current Medication: Outpatient Encounter Medications as of 08/25/2017  Medication Sig  . aspirin 81 MG EC tablet Take 81 mg by mouth daily.  . Multiple Vitamin (MULTI-VITAMINS) TABS Take by mouth.   No facility-administered encounter medications on file as of 08/25/2017.     Surgical History: Past Surgical History:  Procedure Laterality Date  . CARDIAC CATHETERIZATION N/A 03/22/2015   Procedure: Right/Left Heart Cath and Coronary Angiography;  Surgeon: Corey Skains, MD;  Location: [place];  Service: Cardiovascular;  Laterality: N/A;  . MITRAL VALVE REPAIR    . TEE WITHOUT CARDIOVERSION N/A 03/22/2015   Procedure: TRANSESOPHAGEAL ECHOCARDIOGRAM (TEE);  Surgeon: Corey Skains, MD;  Location: [facility];  Service: Cardiovascular;  Laterality: N/A;    Medical History: Past Medical History:  Diagnosis Date  . Heart murmur   . Mitral valve disorder     Family History: Family History  Family history unknown: Yes    Social History   Socioeconomic History  . Marital status: Married    Spouse name: Not on file  . Number of children: Not on file  . Years of education: Not on file  . Highest education level: Not on file  Occupational History  . Not on file  Social Needs  . Financial resource strain: Not on file  . Food insecurity:    Worry: Not on file    Inability: Not on file  .  Transportation needs:    Medical: Not on file    Non-medical: Not on file  Tobacco Use  . Smoking status: Never Smoker  . Smokeless tobacco: Never Used  Substance and Sexual Activity  . Alcohol use: No  . Drug use: No  . Sexual activity: Not on file  Lifestyle  . Physical activity:    Days per week: Not on file    Minutes per session: Not on file  . Stress: Not on file  Relationships  . Social connections:    Talks on phone: Not on file    Gets together: Not on file    Attends religious service: Not on file    Active member of club or organization: Not on file    Attends meetings of clubs or organizations: Not on file    Relationship status: Not on file  . Intimate partner violence:    Fear of current or ex partner: Not on file    Emotionally abused: Not on file    Physically abused: Not on file    Forced sexual activity: Not on file  Other Topics Concern  . Not on file  Social History Narrative  . Not on file      Review of Systems  Constitutional: Negative.  Negative for chills, fatigue and unexpected weight change.  HENT: Negative.  Negative for congestion, rhinorrhea, sneezing and sore throat.   Eyes: Negative for redness.  Respiratory: Negative.  Negative for cough, chest tightness and shortness of breath.   Cardiovascular: Negative.  Negative for chest pain and palpitations.  Gastrointestinal: Negative.  Negative for abdominal pain, constipation, diarrhea, nausea and vomiting.  Endocrine: Negative.   Genitourinary: Negative.  Negative for dysuria and frequency.  Musculoskeletal: Negative.  Negative for arthralgias, back pain, joint swelling and neck pain.  Skin: Negative.  Negative for rash.  Allergic/Immunologic: Negative.   Neurological: Negative.  Negative for tremors and numbness.  Hematological: Negative for adenopathy. Does not bruise/bleed easily.  Psychiatric/Behavioral: Negative.  Negative for behavioral problems, sleep disturbance and suicidal ideas.  The patient is not nervous/anxious.     Vital Signs: BP 132/86   Pulse 68   Temp 98.3 F (36.8 C)   Resp 16   Ht 5\' 10"  (1.778 m)   Wt 223 lb 3.2 oz (101.2 kg)   SpO2 98%   BMI 32.03 kg/m    Physical Exam  Constitutional: He is oriented to person, place, and time. He appears well-developed and well-nourished. No distress.  HENT:  Head: Normocephalic and atraumatic.  Mouth/Throat: Oropharynx is clear and moist. No oropharyngeal exudate.  Eyes: Pupils are equal, round, and reactive to light. EOM are normal.  Neck: Normal range of motion. Neck supple. No JVD present. No tracheal deviation present. No thyromegaly present.  Cardiovascular: Normal rate, regular rhythm and normal heart sounds. Exam reveals no gallop and no friction rub.  No murmur heard. Pulmonary/Chest: Effort normal and breath sounds normal. No respiratory distress. He has no wheezes. He has no rales. He exhibits no tenderness.  Abdominal: Soft. There is no tenderness. There is no guarding.  Musculoskeletal: Normal range of motion.  Lymphadenopathy:    He has no cervical adenopathy.  Neurological: He is alert and oriented to person, place, and time. No cranial nerve deficit.  Skin: Skin is warm and dry. He is not diaphoretic.  Psychiatric: He has a normal mood and affect. His behavior is normal. Judgment and thought content normal.  Nursing note and vitals reviewed.  Assessment/Plan: 1. Sleep disorder Excessive snoring, Apnea episodes, and incrased bmi.  Get sleep study, and follow up in 3 weeks for results.   2. Mitral valve prolapse, nonrheumatic Continue to be followed by cardiology.   3. History of snoring - Home sleep test  4. Apnea - Home sleep test  General Counseling: Efren verbalizes understanding of the findings of todays visit and agrees with plan of treatment. I have discussed any further diagnostic evaluation that may be needed or ordered today. We also reviewed his medications today. he has  been encouraged to call the office with any questions or concerns that should arise related to todays visit.    No orders of the defined types were placed in this encounter.   No orders of the defined types were placed in this encounter.   Time spent: 25 Minutes   This patient was seen by Orson Gear AGNP-C in Collaboration with Dr Lavera Guise as a part of collaborative care agreement    Dr Lavera Guise Internal medicine

## 2017-08-25 NOTE — Patient Instructions (Signed)

## 2017-09-01 ENCOUNTER — Encounter: Payer: Self-pay | Admitting: Adult Health

## 2017-09-03 ENCOUNTER — Other Ambulatory Visit: Payer: BC Managed Care – PPO | Admitting: Internal Medicine

## 2017-09-09 ENCOUNTER — Ambulatory Visit: Payer: Self-pay | Admitting: Internal Medicine

## 2017-09-11 ENCOUNTER — Encounter (INDEPENDENT_AMBULATORY_CARE_PROVIDER_SITE_OTHER): Payer: BC Managed Care – PPO | Admitting: Internal Medicine

## 2017-09-11 DIAGNOSIS — G4710 Hypersomnia, unspecified: Secondary | ICD-10-CM | POA: Diagnosis not present

## 2017-09-11 NOTE — Procedures (Signed)
[facility] [place], [place] [address]  Sleep Specialist: Allyne Gee, MD [facility] Sleep Study Interpretation  Patient Name: Ricky Ross Patient MR [id number] DOB:01/07/1971  Date of Study:   September 04, 2017  Indications for study:  Hypersomnia sleep apnea  BMI:  32 kilogram/meters squared       Respiratory Data:  Total AHI:  2.4 per hour and respiratory index is 5.4 per hour  Total Obstructive Apneas:  0  Total Central Apneas:  0  Total Mixed Apneas:  0  Total Hypopneas:  17  If the AHI is greater than 5 per hour patient qualifies for PAP evaluation  Oximetry Data:  Oxygen Desaturation Index: 4.8 per hour  Lowest Desaturation:  84%  Cardiac Data:  Minimum Heart Rate:  50  Maximum Heart Rate:  94   Impression / Diagnosis:   this home length study shows that the apnea hypopnea index is within normal limits.  The respiratory disturbance index however is 5.4 and is elevated.  This patient may not qualify for CPAP however appears to have significant upper airway disturbance for further assessment.  In addition patient did have brief desaturation and may benefit from getting a overnight oximetry at home.  The increase in respiratory disturbance index can be evaluated by ENT upper airways evaluation.  GENERAL Recommendations:  1.  Consider Auto PAP with pressure ranges 5-20 cmH20 with download, or facility based PAP Titration Study  2.  Consider PAP interface mask fitted for patient comfort, Heated Humidification & PAP compliance monitoring (1 month, 3 months & 12 months after PAP initiation)  3. Consider treatment with mandibular advancement splint (MAS) or referral to an ENT surgeon for modification to the upper airway if the patient prefers an alternate therapy or the PAP trial is unsuccessful  4. Sleep hygiene measures should be discussed with the patient  5. Behavioral therapy such as weight reduction or smoking cessation  as appropriate for the patient  6. Advise patient against the use of alcohol or sedatives in so much as these substances can worsen excessive daytime sleepiness and respiratory disturbances of sleep  7. Advise patient against participating in potentially dangerous activities while drowsy such as operating a motor vehicle, heavy equipment or power tools as it can put them and others in danger  8. Advise patient of the long term consequences of OSA if left untreated, need for treatment and close follow up  9. Clinical follow up as deemed necessary     This Level III home sleep study was performed using the [employer], a 4 channel screening device subject to limitations. Depending on actual total sleep time, not measured in this study, the AHI (sum of apneas and hypopneas/hr of sleep) and therefore the severity of sleep apnea may be underestimated. As with any single night study, including Level 1 attended PSG, severity of sleep apnea may also be underestimated due to the lack of supine and/or REM sleep.  The interpretation associated with this report is based on normal values and degrees of severity in accordance with AASM parameters and/or estimated from multiple sources in the literature for adults ages 35-80+. These may not agree with the displayed values. The patient's treating physician should use the interpretation and recommendations in conjunction with the overall clinical evaluation and treatment of the patient.  Some of the terminology used in this scored [facility] report was developed several years ago and may not always be in accordance with current nomenclature.  This in no way affects the accuracy of the data or the reliability of the interpretation and recommendations.

## 2017-09-16 IMAGING — CT CT ANGIO CHEST
2 of 8 series · 17 of 46 positions shown · IV contrast (isovue)
Comparison: Chest radiograph dated 08/27/2015

CLINICAL DATA: 45-year-old male with left-sided chest pain

EXAM:
CT ANGIOGRAPHY CHEST WITH CONTRAST
TECHNIQUE: Multidetector CT imaging of the chest was performed using the
standard protocol during bolus administration of intravenous
contrast. Multiplanar CT image reconstructions and MIPs were
obtained to evaluate the vascular anatomy.
CONTRAST:  75 cc Isovue 370

[Series 8: thins · axial · 0.69mm/px · z∈[-712,-474]mm · 14 of 262 slices shown]
[im 12/262  lung]
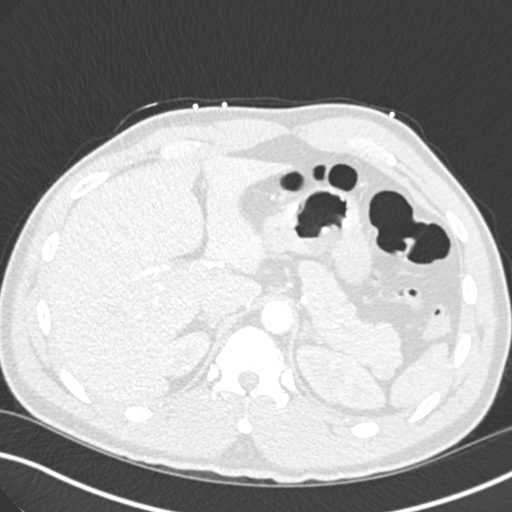
[im 36/262  soft-tissue]
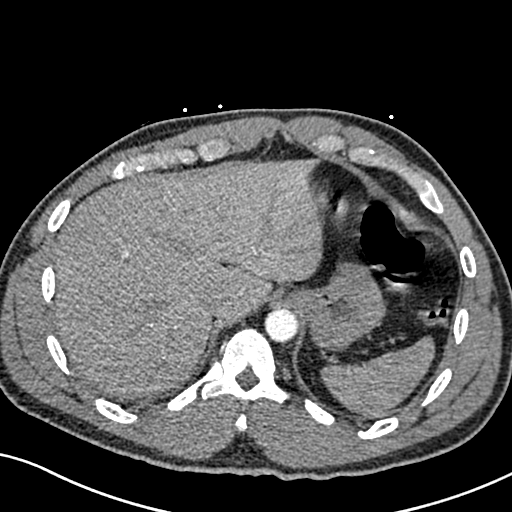
[im 48/262  lung]
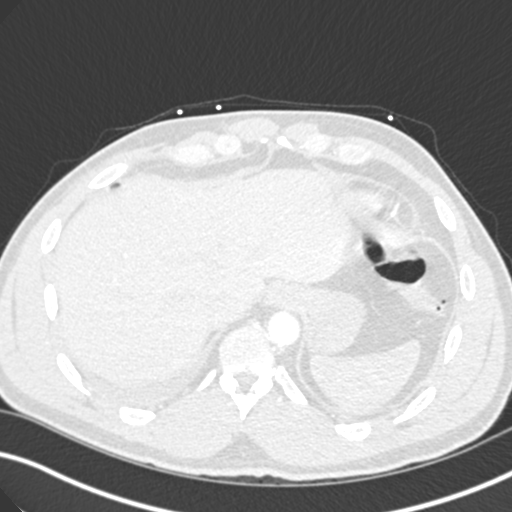
[im 72/262  soft-tissue]
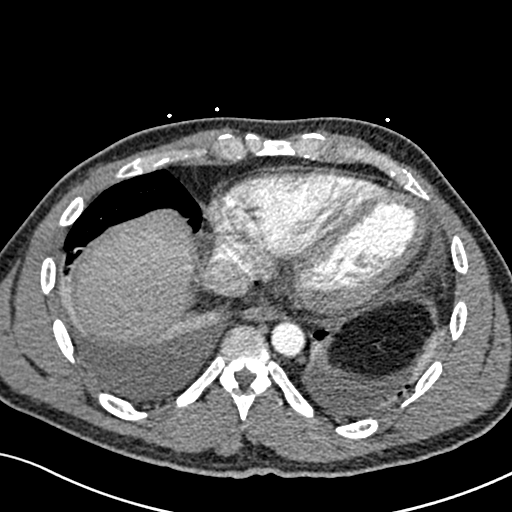
[im 84/262  lung]
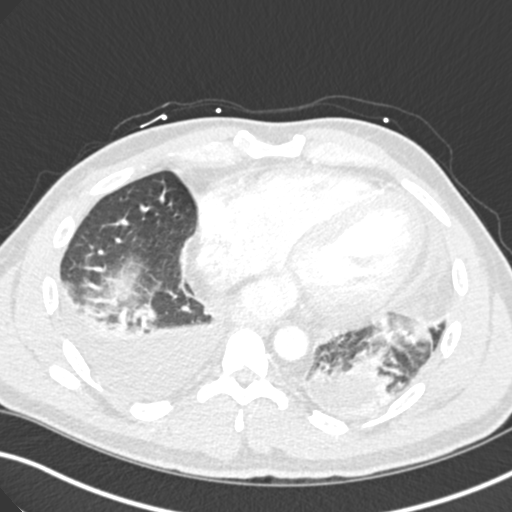
[im 107/262  soft-tissue]
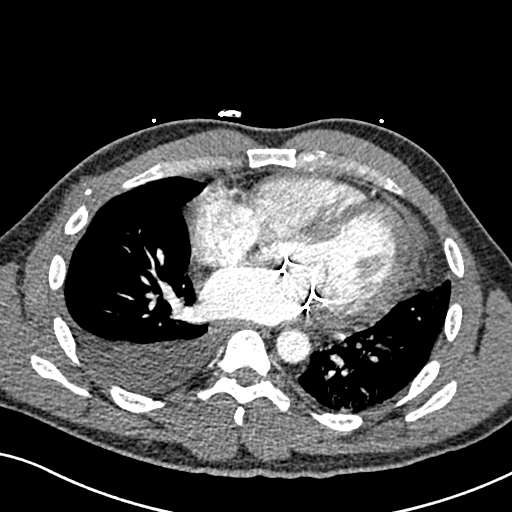
[im 119/262  lung]
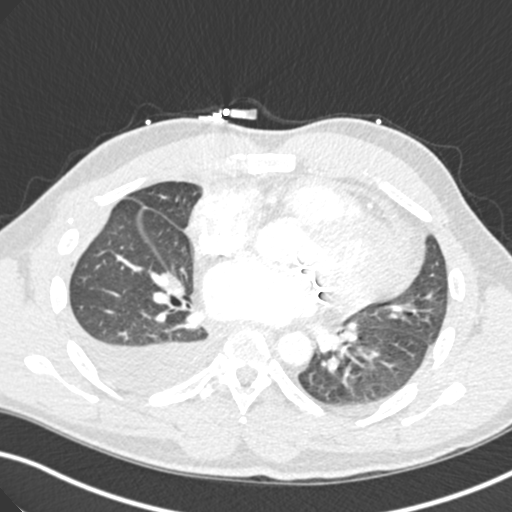
[im 143/262  soft-tissue]
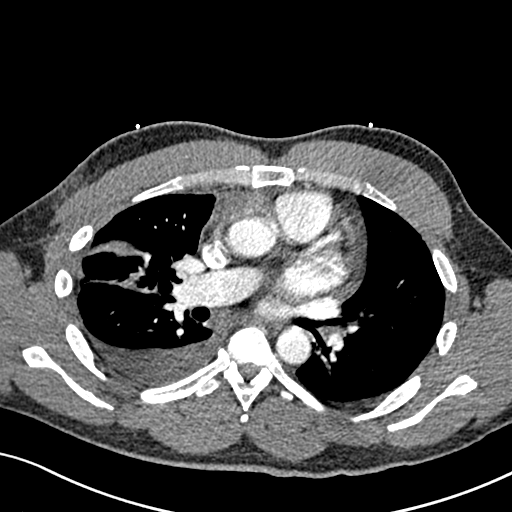
[im 155/262  lung]
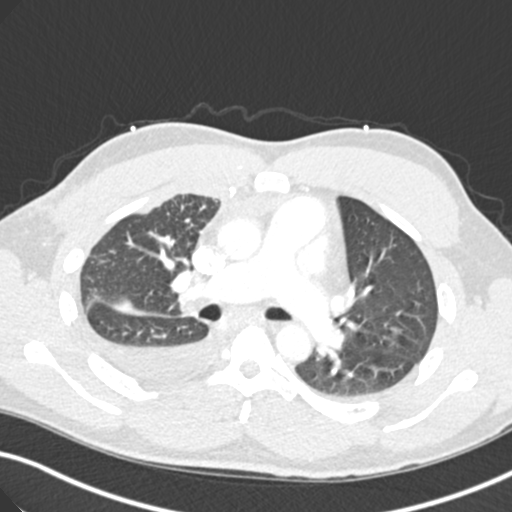
[im 178/262  soft-tissue]
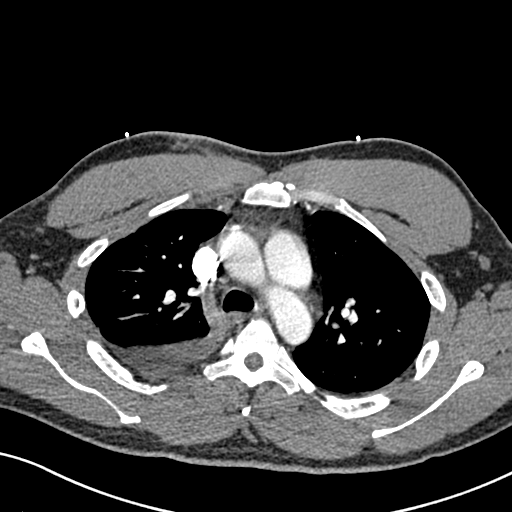
[im 190/262  lung]
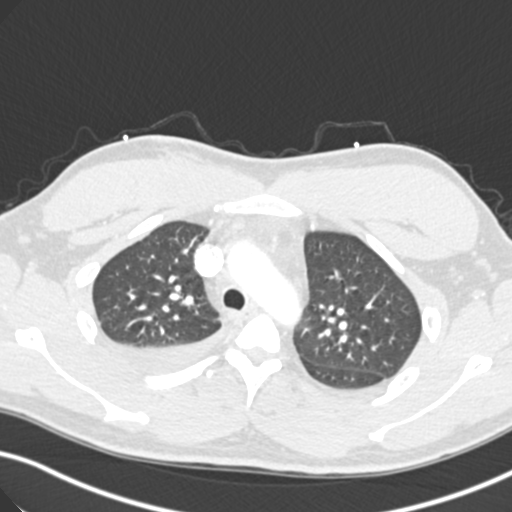
[im 214/262  soft-tissue]
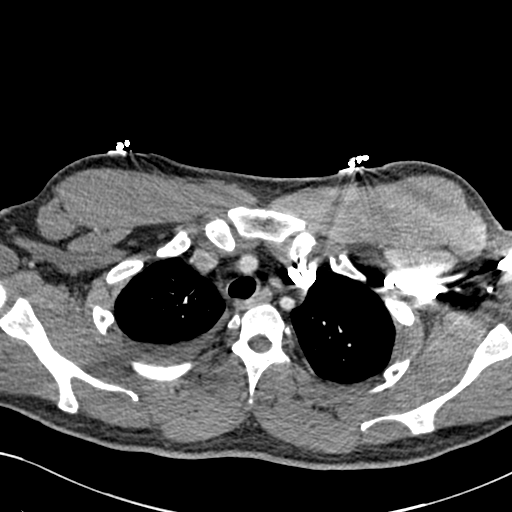
[im 226/262  lung]
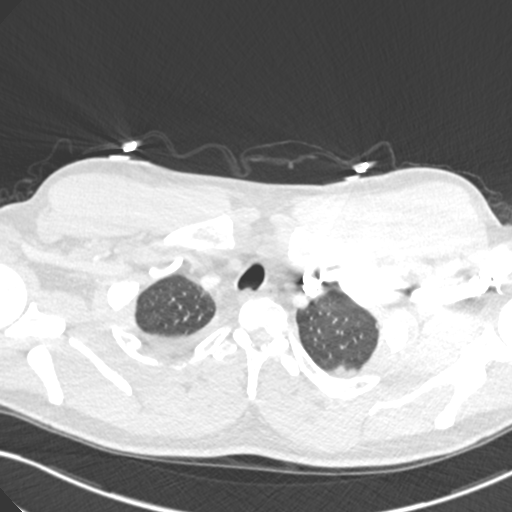
[im 250/262  soft-tissue]
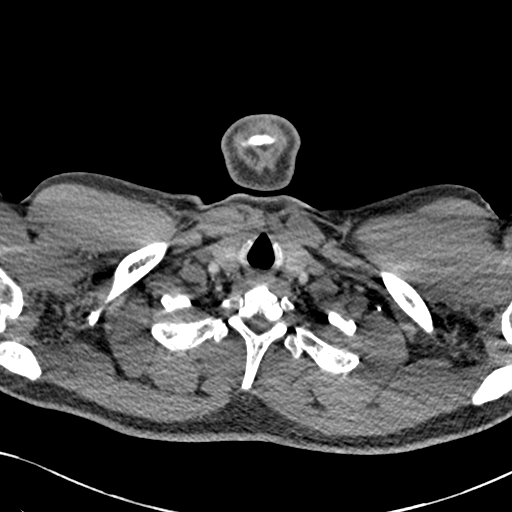

[Series 10: coronal mpr · coronal · 0.55mm/px · 3 of 130 slices shown]
[im 33/130  soft-tissue]
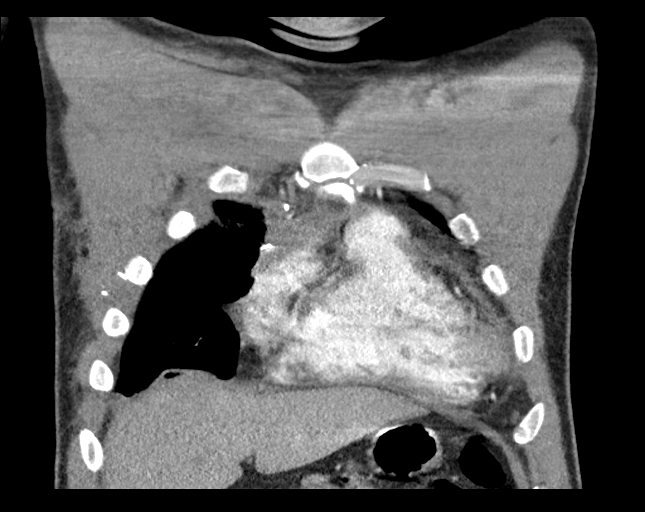
[im 65/130  soft-tissue]
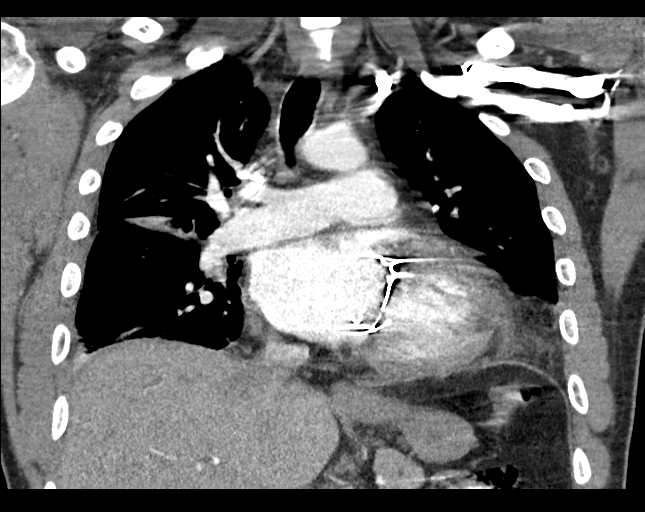
[im 97/130  soft-tissue]
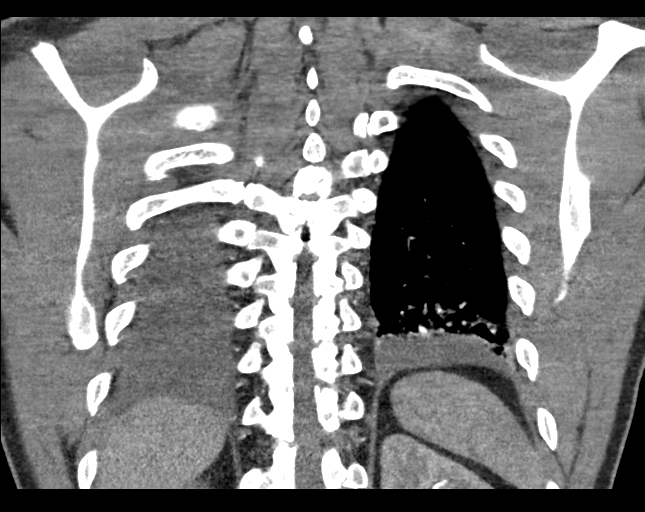

[17 of 46 positions shown; findings below may reference images not displayed]

FINDINGS: There is a moderate right and small left pleural effusion. There is
associated partial compressive atelectasis of the lung bases. Small
amount of fluid is noted in the right minor fissure. There is
subsegmental atelectatic changes of the right middle lobe. Pneumonia
is not excluded. The central airways are patent. There is no
pneumothorax.

The thoracic aorta appears unremarkable. The origins of the great
vessels of the aortic arch appear patent. There is dilatation of the
main pulmonary trunk indicative of underlying pulmonary
hypertension. Evaluation of the pulmonary arteries is limited due to
suboptimal opacification of the peripheral branches. No central
pulmonary artery embolus identified.

There is moderate cardiomegaly and small pericardial effusion.
Mechanical mitral valve. There is diffuse stranding of the anterior
mediastinal fat. No fluid collection. Multiple surgical clips noted
in the anterior mediastinum extending into the anterior chest wall
along the right side of the sternum.

There is no mediastinal or hilar adenopathy. The esophagus is
grossly unremarkable. There is no axillary adenopathy. There is
subcutaneous haziness of the right anterior chest wall with multiple
surgical clips, likely postsurgical changes. Correlation with
history of surgery recommended. The osseous structures are intact.

The visualized upper abdomen appear unremarkable.

Review of the MIP images confirms the above findings.
IMPRESSION: No central pulmonary artery embolus.

Moderate right and small left pleural effusion with associated
partial compressive atelectasis of the lower lobes. There is also
subsegmental atelectatic changes of the right middle lobe. Pneumonia
is not excluded.

Moderate cardiomegaly with small pericardial effusion and small
amount of fluid in the anterior mediastinum, likely postsurgical.
Correlation with surgical history recommended. No drainable fluid
collection or abscess or hematoma identified.

## 2018-01-09 ENCOUNTER — Encounter: Payer: Self-pay | Admitting: Adult Health

## 2018-01-09 ENCOUNTER — Ambulatory Visit: Payer: BC Managed Care – PPO | Admitting: Adult Health

## 2018-01-09 VITALS — BP 122/82 | HR 78 | Temp 98.2°F | Resp 16 | Ht 70.0 in | Wt 229.0 lb

## 2018-01-09 DIAGNOSIS — R42 Dizziness and giddiness: Secondary | ICD-10-CM | POA: Diagnosis not present

## 2018-01-09 DIAGNOSIS — N3289 Other specified disorders of bladder: Secondary | ICD-10-CM

## 2018-01-09 DIAGNOSIS — R300 Dysuria: Secondary | ICD-10-CM | POA: Diagnosis not present

## 2018-01-09 LAB — POCT URINALYSIS DIPSTICK
BILIRUBIN UA: NEGATIVE
Glucose, UA: NEGATIVE
Ketones, UA: NEGATIVE
Leukocytes, UA: NEGATIVE
NITRITE UA: NEGATIVE
PH UA: 6.5 (ref 5.0–8.0)
PROTEIN UA: NEGATIVE
RBC UA: NEGATIVE
SPEC GRAV UA: 1.01 (ref 1.010–1.025)
UROBILINOGEN UA: 0.2 U/dL

## 2018-01-09 MED ORDER — MECLIZINE HCL 25 MG PO TABS: 25.0000 mg | ORAL_TABLET | Freq: Three times a day (TID) | ORAL | 0 refills | Status: DC | PRN

## 2018-01-09 MED ORDER — PHENAZOPYRIDINE HCL 200 MG PO TABS: 200.0000 mg | ORAL_TABLET | Freq: Three times a day (TID) | ORAL | 0 refills | Status: DC | PRN

## 2018-01-09 NOTE — Progress Notes (Signed)
[facility] [place], [place] [address]  Internal MEDICINE  Office Visit Note  Patient Name: Ricky Ross  [id number]  [id number]  Date of Service: 01/09/2018  Chief Complaint  Patient presents with  . Dizziness  . Urinary Frequency    hesistation in urination     HPI Pt is here for a sick visit. Pt reports approximately 2 months of intermittent urinary frequency.  He reports a weak stream on these occasions.  He denies any other urinary symptoms. He also reports some episodes of dizziness when laying down or bending over. These episodes last approximately 30 seconds once he lays down, then they resolve, and start over when he moves.         Current Medication:  Outpatient Encounter Medications as of 01/09/2018  Medication Sig  . Multiple Vitamin (MULTI-VITAMINS) TABS Take by mouth.  . meclizine (MEDI-MECLIZINE) 25 MG tablet Take 1 tablet (25 mg total) by mouth 3 (three) times daily as needed for dizziness.  . phenazopyridine (PYRIDIUM) 200 MG tablet Take 1 tablet (200 mg total) by mouth 3 (three) times daily as needed for pain.   No facility-administered encounter medications on file as of 01/09/2018.       Medical History: Past Medical History:  Diagnosis Date  . Heart murmur   . Mitral valve disorder      Vital Signs: BP 122/82 (BP Location: Left Arm, Patient Position: Sitting, Cuff Size: Normal)   Pulse 78   Temp 98.2 F (36.8 C) (Oral)   Resp 16   Ht 5\' 10"  (1.778 m)   Wt 229 lb (103.9 kg)   SpO2 98%   BMI 32.86 kg/m    Review of Systems  Constitutional: Negative.  Negative for chills, fatigue and unexpected weight change.  HENT: Negative.  Negative for congestion, rhinorrhea, sneezing and sore throat.   Eyes: Negative for redness.  Respiratory: Negative.  Negative for cough, chest tightness and shortness of breath.   Cardiovascular: Negative.  Negative for chest pain and palpitations.  Gastrointestinal: Negative.   Negative for abdominal pain, constipation, diarrhea, nausea and vomiting.  Endocrine: Negative.   Genitourinary: Negative.  Negative for dysuria and frequency.  Musculoskeletal: Negative.  Negative for arthralgias, back pain, joint swelling and neck pain.  Skin: Negative.  Negative for rash.  Allergic/Immunologic: Negative.   Neurological: Positive for dizziness. Negative for tremors and numbness.       Pt reports room spinning with laying flat, or turning on side while laying.   Hematological: Negative for adenopathy. Does not bruise/bleed easily.  Psychiatric/Behavioral: Negative.  Negative for behavioral problems, sleep disturbance and suicidal ideas. The patient is not nervous/anxious.     Physical Exam Vitals signs and nursing note reviewed.  Constitutional:      General: He is not in acute distress.    Appearance: He is well-developed. He is not diaphoretic.  HENT:     Head: Normocephalic and atraumatic.     Right Ear: Tympanic membrane, ear canal and external ear normal.     Left Ear: Tympanic membrane, ear canal and external ear normal.     Mouth/Throat:     Pharynx: No oropharyngeal exudate.  Eyes:     Pupils: Pupils are equal, round, and reactive to light.  Neck:     Musculoskeletal: Normal range of motion and neck supple.     Thyroid: No thyromegaly.     Vascular: No JVD.     Trachea: No tracheal deviation.  Cardiovascular:  Rate and Rhythm: Normal rate and regular rhythm.     Heart sounds: Normal heart sounds. No murmur. No friction rub. No gallop.   Pulmonary:     Effort: Pulmonary effort is normal. No respiratory distress.     Breath sounds: Normal breath sounds. No wheezing or rales.  Chest:     Chest wall: No tenderness.  Abdominal:     Palpations: Abdomen is soft.     Tenderness: There is no abdominal tenderness. There is no guarding.  Musculoskeletal: Normal range of motion.  Lymphadenopathy:     Cervical: No cervical adenopathy.  Skin:    General:  Skin is warm and dry.  Neurological:     Mental Status: He is alert and oriented to person, place, and time.     Cranial Nerves: No cranial nerve deficit.  Psychiatric:        Behavior: Behavior normal.        Thought Content: Thought content normal.        Judgment: Judgment normal.    Assessment/Plan: 1. Vertigo Instructed patient use meclizine as needed for dizziness.  He will take 1 tablet at night before bed and see if this helps with his positional vertigo.  If his symptoms improve he will let us know if they do not we will send him to ENT for evaluation. - meclizine (MEDI-MECLIZINE) 25 MG tablet; Take 1 tablet (25 mg total) by mouth 3 (three) times daily as needed for dizziness.  Dispense: 30 tablet; Refill: 0  2. Dysuria Patient's urine dip clear at this visit. - POCT Urinalysis Dipstick  3. Bladder spasm It is possible patient's symptoms of urinary retention and hesitancy are from bladder spasm.  We discussed the use of Pyridium and a prescription was provided at this visit.  He does report that he feels like this happens more often when he waits a long time before voiding.  I instructed him to try to void regularly and not wait multiple hours before going.  If Pyridium does not work and this new voiding schedule are unsuccessful he will be referred to urology. - phenazopyridine (PYRIDIUM) 200 MG tablet; Take 1 tablet (200 mg total) by mouth 3 (three) times daily as needed for pain.  Dispense: 10 tablet; Refill: 0  General Counseling: [name] verbalizes understanding of the findings of todays visit and agrees with plan of treatment. I have discussed any further diagnostic evaluation that may be needed or ordered today. We also reviewed his medications today. he has been encouraged to call the office with any questions or concerns that should arise related to todays visit.   Orders Placed This Encounter  Procedures  . POCT Urinalysis Dipstick    Meds ordered this encounter   Medications  . meclizine (MEDI-MECLIZINE) 25 MG tablet    Sig: Take 1 tablet (25 mg total) by mouth 3 (three) times daily as needed for dizziness.    Dispense:  30 tablet    Refill:  0  . phenazopyridine (PYRIDIUM) 200 MG tablet    Sig: Take 1 tablet (200 mg total) by mouth 3 (three) times daily as needed for pain.    Dispense:  10 tablet    Refill:  0    Time spent: 25 Minutes  This patient was seen by Orson Gear AGNP-C in Collaboration with Dr Lavera Guise as a part of collaborative care agreement.  Kendell Bane AGNP-C Internal Medicine

## 2018-01-12 ENCOUNTER — Other Ambulatory Visit: Payer: Self-pay | Admitting: Adult Health

## 2018-01-12 ENCOUNTER — Ambulatory Visit: Payer: Self-pay | Admitting: Nurse Practitioner

## 2018-01-13 LAB — COMPREHENSIVE METABOLIC PANEL
A/G RATIO: 1.5 (ref 1.2–2.2)
ALK PHOS: 46 IU/L (ref 39–117)
ALT: 17 IU/L (ref 0–44)
AST: 20 IU/L (ref 0–40)
Albumin: 4 g/dL (ref 3.5–5.5)
BILIRUBIN TOTAL: 0.4 mg/dL (ref 0.0–1.2)
BUN / CREAT RATIO: 8 — AB (ref 9–20)
BUN: 12 mg/dL (ref 6–24)
CHLORIDE: 99 mmol/L (ref 96–106)
CO2: 23 mmol/L (ref 20–29)
Calcium: 9 mg/dL (ref 8.7–10.2)
Creatinine, Ser: 1.59 mg/dL — ABNORMAL HIGH (ref 0.76–1.27)
GFR calc non Af Amer: 50 mL/min/{1.73_m2} — ABNORMAL LOW (ref >59)
GFR, EST AFRICAN AMERICAN: 58 mL/min/{1.73_m2} — AB (ref >59)
Globulin, Total: 2.7 g/dL (ref 1.5–4.5)
Glucose: 88 mg/dL (ref 65–99)
POTASSIUM: 4.5 mmol/L (ref 3.5–5.2)
Sodium: 138 mmol/L (ref 134–144)
TOTAL PROTEIN: 6.7 g/dL (ref 6.0–8.5)

## 2018-01-13 LAB — CBC WITH DIFFERENTIAL/PLATELET
BASOS: 1 %
Basophils Absolute: 0 10*3/uL (ref 0.0–0.2)
EOS (ABSOLUTE): 0 10*3/uL (ref 0.0–0.4)
Eos: 1 %
Hematocrit: 48.8 % (ref 37.5–51.0)
Hemoglobin: 16.2 g/dL (ref 13.0–17.7)
IMMATURE GRANS (ABS): 0 10*3/uL (ref 0.0–0.1)
Immature Granulocytes: 0 %
LYMPHS ABS: 1.8 10*3/uL (ref 0.7–3.1)
Lymphs: 39 %
MCH: 29.6 pg (ref 26.6–33.0)
MCHC: 33.2 g/dL (ref 31.5–35.7)
MCV: 89 fL (ref 79–97)
MONOS ABS: 0.5 10*3/uL (ref 0.1–0.9)
Monocytes: 10 %
NEUTROS ABS: 2.2 10*3/uL (ref 1.4–7.0)
Neutrophils: 49 %
Platelets: 183 10*3/uL (ref 150–450)
RBC: 5.47 x10E6/uL (ref 4.14–5.80)
RDW: 13.7 % (ref 12.3–15.4)
WBC: 4.5 10*3/uL (ref 3.4–10.8)

## 2018-01-13 LAB — LIPID PANEL WITH LDL/HDL RATIO
CHOLESTEROL TOTAL: 188 mg/dL (ref 100–199)
HDL: 53 mg/dL (ref >39)
LDL Calculated: 117 mg/dL — ABNORMAL HIGH (ref 0–99)
LDl/HDL Ratio: 2.2 ratio (ref 0.0–3.6)
Triglycerides: 88 mg/dL (ref 0–149)
VLDL Cholesterol Cal: 18 mg/dL (ref 5–40)

## 2018-01-13 LAB — PSA: Prostate Specific Ag, Serum: 1 ng/mL (ref 0.0–4.0)

## 2018-01-13 LAB — T4, FREE: FREE T4: 1.3 ng/dL (ref 0.82–1.77)

## 2018-01-13 LAB — TSH: TSH: 1.75 u[IU]/mL (ref 0.450–4.500)

## 2018-02-02 ENCOUNTER — Telehealth: Payer: Self-pay

## 2018-02-04 NOTE — Telephone Encounter (Signed)
I did not see him, but looks like he was dehydrated. Was he seen for illness that day? We can recheck BMP. Otherwise his labs were good.

## 2018-02-04 NOTE — Telephone Encounter (Signed)
Pt advised for labs and advised repeat labs in 4 weeks and mailed labs slip

## 2018-03-11 ENCOUNTER — Other Ambulatory Visit: Payer: Self-pay | Admitting: Nurse Practitioner

## 2018-03-12 LAB — BASIC METABOLIC PANEL
BUN/Creatinine Ratio: 10 (ref 9–20)
BUN: 14 mg/dL (ref 6–24)
CALCIUM: 9.1 mg/dL (ref 8.7–10.2)
CO2: 20 mmol/L (ref 20–29)
Chloride: 101 mmol/L (ref 96–106)
Creatinine, Ser: 1.45 mg/dL — ABNORMAL HIGH (ref 0.76–1.27)
GFR, EST AFRICAN AMERICAN: 65 mL/min/{1.73_m2} (ref >59)
GFR, EST NON AFRICAN AMERICAN: 56 mL/min/{1.73_m2} — AB (ref >59)
Glucose: 91 mg/dL (ref 65–99)
POTASSIUM: 4.7 mmol/L (ref 3.5–5.2)
Sodium: 139 mmol/L (ref 134–144)

## 2018-03-23 ENCOUNTER — Ambulatory Visit: Payer: Self-pay | Admitting: Adult Health

## 2018-03-24 ENCOUNTER — Ambulatory Visit: Payer: BC Managed Care – PPO | Admitting: Adult Health

## 2018-03-24 ENCOUNTER — Encounter: Payer: Self-pay | Admitting: Adult Health

## 2018-03-24 VITALS — BP 130/76 | HR 85 | Resp 16 | Ht 70.0 in | Wt 221.8 lb

## 2018-03-24 DIAGNOSIS — R21 Rash and other nonspecific skin eruption: Secondary | ICD-10-CM

## 2018-03-24 DIAGNOSIS — R7989 Other specified abnormal findings of blood chemistry: Secondary | ICD-10-CM | POA: Diagnosis not present

## 2018-03-24 DIAGNOSIS — N3289 Other specified disorders of bladder: Secondary | ICD-10-CM | POA: Diagnosis not present

## 2018-03-24 DIAGNOSIS — R42 Dizziness and giddiness: Secondary | ICD-10-CM

## 2018-03-24 NOTE — Progress Notes (Signed)
[facility] [place], [place] [address]  Internal MEDICINE  Office Visit Note  Patient Name: Ricky Ross  [id number]  [id number]  Date of Service: 03/24/2018  Chief Complaint  Patient presents with  . Labs Only    Lab results  . Rash    skin rash, comes and goes, been prsent for about three weeks, possibly have something to do with what he is eating     HPI Pt is here for a sick visit. Reviewed lab results with patient.  GFR has normalized, and his creatine it decreasing back to normal.  Will continue to follow up with patient and repeat labs at future visit.  Patient is also concerned about small scaly patchy rash to his thighs.  He has seen dermatology who prescribed him triamcinolone cream which appears to be making it go away however new patches keep following up.        Current Medication:  Outpatient Encounter Medications as of 03/24/2018  Medication Sig  . meclizine (MEDI-MECLIZINE) 25 MG tablet Take 1 tablet (25 mg total) by mouth 3 (three) times daily as needed for dizziness.  . Multiple Vitamin (MULTI-VITAMINS) TABS Take by mouth.  . phenazopyridine (PYRIDIUM) 200 MG tablet Take 1 tablet (200 mg total) by mouth 3 (three) times daily as needed for pain.   No facility-administered encounter medications on file as of 03/24/2018.       Medical History: Past Medical History:  Diagnosis Date  . Heart murmur   . Mitral valve disorder      Vital Signs: BP 130/76 (BP Location: Right Arm, Patient Position: Sitting, Cuff Size: Large)   Pulse 85   Resp 16   Ht 5\' 10"  (1.778 m)   Wt 221 lb 12.8 oz (100.6 kg)   SpO2 97%   BMI 31.82 kg/m    Review of Systems  Constitutional: Negative.  Negative for chills, fatigue and unexpected weight change.  HENT: Negative.  Negative for congestion, rhinorrhea, sneezing and sore throat.   Eyes: Negative for redness.  Respiratory: Negative.  Negative for cough, chest tightness and shortness of breath.    Cardiovascular: Negative.  Negative for chest pain and palpitations.  Gastrointestinal: Negative.  Negative for abdominal pain, constipation, diarrhea, nausea and vomiting.  Endocrine: Negative.   Genitourinary: Negative.  Negative for dysuria and frequency.  Musculoskeletal: Negative.  Negative for arthralgias, back pain, joint swelling and neck pain.  Skin: Positive for rash.  Allergic/Immunologic: Negative.   Neurological: Negative.  Negative for tremors and numbness.  Hematological: Negative for adenopathy. Does not bruise/bleed easily.  Psychiatric/Behavioral: Negative.  Negative for behavioral problems, sleep disturbance and suicidal ideas. The patient is not nervous/anxious.     Physical Exam Vitals signs and nursing note reviewed.  Constitutional:      General: He is not in acute distress.    Appearance: He is well-developed. He is not diaphoretic.  HENT:     Head: Normocephalic and atraumatic.     Mouth/Throat:     Pharynx: No oropharyngeal exudate.  Eyes:     Pupils: Pupils are equal, round, and reactive to light.  Neck:     Musculoskeletal: Normal range of motion and neck supple.     Thyroid: No thyromegaly.     Vascular: No JVD.     Trachea: No tracheal deviation.  Cardiovascular:     Rate and Rhythm: Normal rate and regular rhythm.     Heart sounds: Normal heart sounds. No murmur. No friction rub.  No gallop.   Pulmonary:     Effort: Pulmonary effort is normal. No respiratory distress.     Breath sounds: Normal breath sounds. No wheezing or rales.  Chest:     Chest wall: No tenderness.  Abdominal:     Palpations: Abdomen is soft.     Tenderness: There is no abdominal tenderness. There is no guarding.  Musculoskeletal: Normal range of motion.  Lymphadenopathy:     Cervical: No cervical adenopathy.  Skin:    General: Skin is warm and dry.     Findings: Rash present.     Comments: Small diffuse scaly patchy areas to thighs.  Some older, healed/smooth areas  noted also.   Neurological:     Mental Status: He is alert and oriented to person, place, and time.     Cranial Nerves: No cranial nerve deficit.  Psychiatric:        Behavior: Behavior normal.        Thought Content: Thought content normal.        Judgment: Judgment normal.       Assessment/Plan: 1. Bladder spasm Resolved per patient as this visit.   2. Vertigo Resolved.  Pt denies any recent issues.  NO longer using meclizine.   3. Rash Small scaly, patchy areas that itch on his upper legs and inner thighs.  Pt is using Triamcinolone cream prescribed by dermatology. Follow up with Derm if areas worsen, or fail to resolve with cream.   4. Elevated serum creatinine Patient's creatinine remains elevated at this time.  However is improving.  Will get renal ultrasound and recheck patient's creatinine in 2 months. - US Renal; Future General Counseling: Jeffrey verbalizes understanding of the findings of todays visit and agrees with plan of treatment. I have discussed any further diagnostic evaluation that may be needed or ordered today. We also reviewed his medications today. he has been encouraged to call the office with any questions or concerns that should arise related to todays visit.   Orders Placed This Encounter  Procedures  . US Renal    No orders of the defined types were placed in this encounter.   Time spent: 25 Minutes  This patient was seen by Orson Gear AGNP-C in Collaboration with Dr Lavera Guise as a part of collaborative care agreement.  Kendell Bane AGNP-C Internal Medicine

## 2018-03-24 NOTE — Patient Instructions (Signed)
Vertigo    Vertigo means that you feel like you are moving when you are not. Vertigo can also make you feel like things around you are moving when they are not. This feeling can come and go at any time. Vertigo often goes away on its own.  Follow these instructions at home:  · Avoid making fast movements.  · Avoid driving.  · Avoid using heavy machinery.  · Avoid doing any task or activity that might cause danger to you or other people if you would have a vertigo attack while you are doing it.  · Sit down right away if you feel dizzy or have trouble with your balance.  · Take over-the-counter and prescription medicines only as told by your doctor.  · Follow instructions from your doctor about which positions or movements you should avoid.  · Drink enough fluid to keep your pee (urine) clear or pale yellow.  · Keep all follow-up visits as told by your doctor. This is important.  Contact a doctor if:  · Medicine does not help your vertigo.  · You have a fever.  · Your problems get worse or you have new symptoms.  · Your family or friends see changes in your behavior.  · You feel sick to your stomach (nauseous) or you throw up (vomit).  · You have a “pins and needles” feeling or you are numb in part of your body.  Get help right away if:  · You have trouble moving or talking.  · You are always dizzy.  · You pass out (faint).  · You get very bad headaches.  · You feel weak or have trouble using your hands, arms, or legs.  · You have changes in your hearing.  · You have changes in your seeing (vision).  · You get a stiff neck.  · Bright light starts to bother you.  This information is not intended to replace advice given to you by your health care provider. Make sure you discuss any questions you have with your health care provider.  Document Released: 10/24/2007 Document Revised: 06/22/2015 Document Reviewed: 05/09/2014  Elsevier Interactive Patient Education © 2019 [employer] Inc[employer]

## 2018-03-31 ENCOUNTER — Ambulatory Visit: Payer: BC Managed Care – PPO | Admitting: Nurse Practitioner

## 2018-03-31 ENCOUNTER — Encounter: Payer: Self-pay | Admitting: Nurse Practitioner

## 2018-03-31 ENCOUNTER — Other Ambulatory Visit: Payer: Self-pay

## 2018-03-31 VITALS — BP 123/77 | HR 98 | Temp 101.7°F | Resp 16 | Ht 70.0 in | Wt 219.6 lb

## 2018-03-31 DIAGNOSIS — J101 Influenza due to other identified influenza virus with other respiratory manifestations: Secondary | ICD-10-CM

## 2018-03-31 DIAGNOSIS — J069 Acute upper respiratory infection, unspecified: Secondary | ICD-10-CM

## 2018-03-31 DIAGNOSIS — R509 Fever, unspecified: Secondary | ICD-10-CM | POA: Diagnosis not present

## 2018-03-31 LAB — POCT INFLUENZA A/B
Influenza A, POC: NEGATIVE
Influenza B, POC: NEGATIVE

## 2018-03-31 MED ORDER — CEFUROXIME AXETIL 500 MG PO TABS: 500.0000 mg | ORAL_TABLET | Freq: Two times a day (BID) | ORAL | 0 refills | Status: DC

## 2018-03-31 MED ORDER — OSELTAMIVIR PHOSPHATE 75 MG PO CAPS: 75.0000 mg | ORAL_CAPSULE | Freq: Two times a day (BID) | ORAL | 0 refills | Status: DC

## 2018-03-31 NOTE — Progress Notes (Signed)
[facility] [place], [place] [address]  Internal MEDICINE  Office Visit Note  Patient Name: Ricky Ross  [id number]  [id number]  Date of Service: 04/15/2018    Pt is here for a sick visit.  Chief Complaint  Patient presents with  . Cough    TINGLING IN THE SPINE WHEN COUGH, COUGHING SINCE SUNDAY, no vomiting or diarrhea  . Chills    Occasionally, no runny nose, pt has bot been around around anyone sick that he know of but was around a crowd on saturday,  . Headache    only when coughing pt thinks when it hurts its due to pressure, pt has not traveled outside of the country  . Fever     Fever   This is a new problem. The current episode started yesterday. The problem occurs constantly. The problem has been unchanged. The maximum temperature noted was 101 to 101.9 F. The temperature was taken using an oral thermometer. Associated symptoms include congestion, coughing, headaches, muscle aches, nausea, sleepiness and a sore throat. Pertinent negatives include no abdominal pain, chest pain, diarrhea or vomiting. He has tried nothing for the symptoms.  Risk factors: sick contacts        Current Medication:  Outpatient Encounter Medications as of 03/31/2018  Medication Sig  . cefUROXime (CEFTIN) 500 MG tablet Take 1 tablet (500 mg total) by mouth 2 (two) times daily with a meal.  . meclizine (MEDI-MECLIZINE) 25 MG tablet Take 1 tablet (25 mg total) by mouth 3 (three) times daily as needed for dizziness.  . Multiple Vitamin (MULTI-VITAMINS) TABS Take by mouth.  . oseltamivir (TAMIFLU) 75 MG capsule Take 1 capsule (75 mg total) by mouth 2 (two) times daily.  . phenazopyridine (PYRIDIUM) 200 MG tablet Take 1 tablet (200 mg total) by mouth 3 (three) times daily as needed for pain.   No facility-administered encounter medications on file as of 03/31/2018.       Medical History: Past Medical History:  Diagnosis Date  . Heart murmur   . Mitral valve  disorder      Today's Vitals   03/31/18 1540  BP: 123/77  Pulse: 98  Resp: 16  Temp: (!) 101.7 F (38.7 C)  SpO2: 95%  Weight: 219 lb 9.6 oz (99.6 kg)  Height: 5\' 10"  (1.778 m)   Body mass index is 31.51 kg/m.  Review of Systems  Constitutional: Positive for chills, fatigue and fever. Negative for unexpected weight change.  HENT: Positive for congestion, postnasal drip and sore throat. Negative for rhinorrhea and sneezing.   Respiratory: Positive for cough. Negative for chest tightness and shortness of breath.   Cardiovascular: Negative for chest pain and palpitations.  Gastrointestinal: Positive for nausea. Negative for abdominal pain, constipation, diarrhea and vomiting.  Musculoskeletal: Positive for myalgias. Negative for arthralgias, back pain, joint swelling and neck pain.  Neurological: Positive for headaches. Negative for tremors and numbness.  Hematological: Positive for adenopathy.  Psychiatric/Behavioral: Behavioral problem: Depression.    Physical Exam Vitals signs and nursing note reviewed.  Constitutional:      General: He is not in acute distress.    Appearance: He is well-developed. He is ill-appearing. He is not diaphoretic.  HENT:     Head: Normocephalic and atraumatic.     Right Ear: Tympanic membrane is erythematous and bulging.     Left Ear: Tympanic membrane is erythematous and bulging.     Mouth/Throat:     Pharynx: Posterior oropharyngeal erythema present. No oropharyngeal  exudate.  Eyes:     Pupils: Pupils are equal, round, and reactive to light.  Neck:     Musculoskeletal: Normal range of motion and neck supple.     Thyroid: No thyromegaly.     Vascular: No JVD.     Trachea: No tracheal deviation.  Cardiovascular:     Rate and Rhythm: Regular rhythm. Tachycardia present.     Heart sounds: Normal heart sounds. No murmur. No friction rub. No gallop.   Pulmonary:     Effort: Pulmonary effort is normal. No respiratory distress.     Breath  sounds: Normal breath sounds. No wheezing or rales.  Chest:     Chest wall: No tenderness.  Abdominal:     General: Bowel sounds are normal.     Palpations: Abdomen is soft.  Musculoskeletal: Normal range of motion.  Lymphadenopathy:     Cervical: Cervical adenopathy present.  Skin:    General: Skin is warm and dry.  Neurological:     Mental Status: He is alert and oriented to person, place, and time.     Cranial Nerves: No cranial nerve deficit.  Psychiatric:        Behavior: Behavior normal.        Thought Content: Thought content normal.        Judgment: Judgment normal.   Assessment/Plan: 1. Fever and chills Flu test negative, however, will treat for flu based on severity and onset of symptoms.  - POCT Influenza A/B  2. Influenza A tamiflu 75mg  twice daily for next 5 days. Rest and increase fluids. OTC medication should be used to improve symptoms.  - oseltamivir (TAMIFLU) 75 MG capsule; Take 1 capsule (75 mg total) by mouth 2 (two) times daily.  Dispense: 10 capsule; Refill: 0  3. Acute upper respiratory infection Start ceftin 500mg  twice daily for 10 days. Rest and increase fluids. OTC medication should be used to improve symptoms.  - cefUROXime (CEFTIN) 500 MG tablet; Take 1 tablet (500 mg total) by mouth 2 (two) times daily with a meal.  Dispense: 20 tablet; Refill: 0  General Counseling: Ricky Ross verbalizes understanding of the findings of todays visit and agrees with plan of treatment. I have discussed any further diagnostic evaluation that may be needed or ordered today. We also reviewed his medications today. he has been encouraged to call the office with any questions or concerns that should arise related to todays visit.    Counseling:  Rest and increase fluids. Continue using OTC medication to control symptoms.   This patient was seen by Leretha Pol FNP Collaboration with Dr Lavera Guise as a part of collaborative care agreement  Orders Placed This Encounter   Procedures  . POCT Influenza A/B    Meds ordered this encounter  Medications  . cefUROXime (CEFTIN) 500 MG tablet    Sig: Take 1 tablet (500 mg total) by mouth 2 (two) times daily with a meal.    Dispense:  20 tablet    Refill:  0    Order Specific Question:   Supervising Provider    Answer:   Lavera Guise [place]  . oseltamivir (TAMIFLU) 75 MG capsule    Sig: Take 1 capsule (75 mg total) by mouth 2 (two) times daily.    Dispense:  10 capsule    Refill:  0    Order Specific Question:   Supervising Provider    Answer:   Lavera Guise [[id number]]    Time spent: 25 Minutes

## 2018-04-03 ENCOUNTER — Ambulatory Visit (INDEPENDENT_AMBULATORY_CARE_PROVIDER_SITE_OTHER): Payer: BC Managed Care – PPO

## 2018-04-03 DIAGNOSIS — R7989 Other specified abnormal findings of blood chemistry: Secondary | ICD-10-CM

## 2018-04-15 DIAGNOSIS — J069 Acute upper respiratory infection, unspecified: Secondary | ICD-10-CM | POA: Insufficient documentation

## 2018-04-15 DIAGNOSIS — J101 Influenza due to other identified influenza virus with other respiratory manifestations: Secondary | ICD-10-CM | POA: Insufficient documentation

## 2018-04-15 DIAGNOSIS — R509 Fever, unspecified: Secondary | ICD-10-CM | POA: Insufficient documentation

## 2018-06-18 ENCOUNTER — Encounter: Payer: Self-pay | Admitting: Nurse Practitioner

## 2018-06-18 ENCOUNTER — Other Ambulatory Visit: Payer: Self-pay

## 2018-06-18 ENCOUNTER — Ambulatory Visit: Payer: BC Managed Care – PPO | Admitting: Nurse Practitioner

## 2018-06-18 VITALS — Ht 70.0 in | Wt 212.0 lb

## 2018-06-18 DIAGNOSIS — D483 Neoplasm of uncertain behavior of retroperitoneum: Secondary | ICD-10-CM | POA: Diagnosis not present

## 2018-06-18 DIAGNOSIS — D484 Neoplasm of uncertain behavior of peritoneum: Secondary | ICD-10-CM

## 2018-06-18 DIAGNOSIS — L209 Atopic dermatitis, unspecified: Secondary | ICD-10-CM | POA: Diagnosis not present

## 2018-06-18 DIAGNOSIS — N281 Cyst of kidney, acquired: Secondary | ICD-10-CM | POA: Diagnosis not present

## 2018-06-18 MED ORDER — TRIAMCINOLONE ACETONIDE 0.025 % EX CREA: 1.0000 "application " | TOPICAL_CREAM | Freq: Two times a day (BID) | CUTANEOUS | 2 refills | Status: DC

## 2018-06-18 NOTE — Progress Notes (Signed)
[facility] [place], [place] [address]  Internal MEDICINE  Telephone Visit  Patient Name: Ricky Ross  [id number]  [id number]  Date of Service: 07/04/2018  I connected with the patient at 4:56pm by embalm  and verified the patients identity using two identifiers.   I discussed the limitations, risks, security and privacy concerns of performing an evaluation and management service by webcam and the availability of in person appointments. I also discussed with the patient that there may be a patient responsible charge related to the service.  The patient expressed understanding and agrees to proceed.    Chief Complaint  Patient presents with  . Telephone Assessment    VIDEO VISIT [phone]  . Telephone Screen  . Medical Management of Chronic Issues    3 month follow up, discuss returning to work  . Labs Only    review labs and other test    The patient has been contacted via webcam for follow up visit due to concerns for spread of novel coronavirus. The patient is here to review the results of renal ultrasound. 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 There is also a possible nodular density between the spleen and left kidney of unclear etiology. He also has a mild prominent prostate gland.        Current Medication: Outpatient Encounter Medications as of 06/18/2018  Medication Sig  . Multiple Vitamin (MULTI-VITAMINS) TABS Take by mouth.  . triamcinolone (KENALOG) 0.025 % cream Apply 1 application topically 2 (two) times daily.  . [DISCONTINUED] cefUROXime (CEFTIN) 500 MG tablet Take 1 tablet (500 mg total) by mouth 2 (two) times daily with a meal. (Patient not taking: Reported on 06/18/2018)  . [DISCONTINUED] meclizine (MEDI-MECLIZINE) 25 MG tablet Take 1 tablet (25 mg total) by mouth 3 (three) times daily as needed for dizziness. (Patient not taking: Reported on 06/18/2018)  . [DISCONTINUED] oseltamivir (TAMIFLU)  75 MG capsule Take 1 capsule (75 mg total) by mouth 2 (two) times daily. (Patient not taking: Reported on 06/18/2018)  . [DISCONTINUED] phenazopyridine (PYRIDIUM) 200 MG tablet Take 1 tablet (200 mg total) by mouth 3 (three) times daily as needed for pain. (Patient not taking: Reported on 06/18/2018)   No facility-administered encounter medications on file as of 06/18/2018.     Surgical History: Past Surgical History:  Procedure Laterality Date  . CARDIAC CATHETERIZATION N/A 03/22/2015   Procedure: Right/Left Heart Cath and Coronary Angiography;  Surgeon: Corey Skains, MD;  Location: [facility];  Service: Cardiovascular;  Laterality: N/A;  . MITRAL VALVE REPAIR    . TEE WITHOUT CARDIOVERSION N/A 03/22/2015   Procedure: TRANSESOPHAGEAL ECHOCARDIOGRAM (TEE);  Surgeon: Corey Skains, MD;  Location: [facility];  Service: Cardiovascular;  Laterality: N/A;    Medical History: Past Medical History:  Diagnosis Date  . Heart murmur   . Mitral valve disorder     Family History: Family History  Family history unknown: Yes    Social History   Socioeconomic History  . Marital status: Married    Spouse name: Not on file  . Number of children: Not on file  . Years of education: Not on file  . Highest education level: Not on file  Occupational History  . Not on file  Social Needs  . Financial resource strain: Not on file  . Food insecurity:    Worry: Not on file    Inability: Not on file  . Transportation needs:  Medical: Not on file    Non-medical: Not on file  Tobacco Use  . Smoking status: Never Smoker  . Smokeless tobacco: Never Used  Substance and Sexual Activity  . Alcohol use: No  . Drug use: No  . Sexual activity: Not on file  Lifestyle  . Physical activity:    Days per week: Not on file    Minutes per session: Not on file  . Stress: Not on file  Relationships  . Social connections:    Talks on phone: Not on file    Gets together: Not on file     Attends religious service: Not on file    Active member of club or organization: Not on file    Attends meetings of clubs or organizations: Not on file    Relationship status: Not on file  . Intimate partner violence:    Fear of current or ex partner: Not on file    Emotionally abused: Not on file    Physically abused: Not on file    Forced sexual activity: Not on file  Other Topics Concern  . Not on file  Social History Narrative  . Not on file      Review of Systems  Constitutional: Negative for chills, fatigue and unexpected weight change.  HENT: Negative for congestion, rhinorrhea, sneezing and sore throat.   Respiratory: Negative for cough, chest tightness and shortness of breath.   Cardiovascular: Negative for chest pain and palpitations.  Gastrointestinal: Negative for abdominal pain, constipation, diarrhea, nausea and vomiting.  Genitourinary: Negative for decreased urine volume, dysuria, flank pain, frequency, testicular pain and urgency.  Musculoskeletal: Negative for arthralgias, back pain, joint swelling and neck pain.  Skin: Positive for rash.  Allergic/Immunologic: Negative for environmental allergies.  Neurological: Negative for dizziness, tremors, numbness and headaches.  Hematological: Negative for adenopathy. Does not bruise/bleed easily.  Psychiatric/Behavioral: Negative for behavioral problems, sleep disturbance and suicidal ideas. The patient is not nervous/anxious.     Today's Vitals   06/18/18 1634  Weight: 212 lb (96.2 kg)  Height: 5\' 10"  (1.778 m)   Body mass index is 30.42 kg/m.  Observation/Objective:  The patient is alert and oriented. He is pleasant and answering all questions appropriately. Breathing is non-labored. He is in no acute distress.    Assessment/Plan: 1. Atopic dermatitis, unspecified type May continue triamcinolone 0.025% cream. Apply to affected areas twice daily as needed.  - triamcinolone (KENALOG) 0.025 % cream; Apply 1  application topically 2 (two) times daily.  Dispense: 80 g; Refill: 2  2. Acquired renal cyst of left kidney 46mm cyst of left kidney present on ultrasound exam. Will get CT of the kidneys/abdomen for further evaluation.   3. 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 Will get CT of the kidneys/abdomen for further evaluation.  - CT RENAL ABD W/WO; Future  General Counseling: Kalil verbalizes understanding of the findings of today's phone visit and agrees with plan of treatment. I have discussed any further diagnostic evaluation that may be needed or ordered today. We also reviewed his medications today. he has been encouraged to call the office with any questions or concerns that should arise related to todays visit.  This patient was seen by Leretha Pol FNP Collaboration with Dr Lavera Guise as a part of collaborative care agreement  Orders Placed This Encounter  Procedures  . CT RENAL ABD W/WO    Meds  ordered this encounter  Medications  . triamcinolone (KENALOG) 0.025 % cream    Sig: Apply 1 application topically 2 (two) times daily.    Dispense:  80 g    Refill:  2    Per dermatology    Order Specific Question:   Supervising Provider    Answer:   Lavera Guise [[id number]]    Time spent: 85 Minutes    Dr Lavera Guise Internal medicine

## 2018-06-24 ENCOUNTER — Ambulatory Visit: Payer: Self-pay | Admitting: Nurse Practitioner

## 2018-07-04 DIAGNOSIS — D483 Neoplasm of uncertain behavior of retroperitoneum: Secondary | ICD-10-CM | POA: Insufficient documentation

## 2018-07-04 DIAGNOSIS — L209 Atopic dermatitis, unspecified: Secondary | ICD-10-CM | POA: Insufficient documentation

## 2018-07-04 DIAGNOSIS — N281 Cyst of kidney, acquired: Secondary | ICD-10-CM | POA: Insufficient documentation

## 2018-07-04 DIAGNOSIS — D484 Neoplasm of uncertain behavior of peritoneum: Secondary | ICD-10-CM | POA: Insufficient documentation

## 2018-07-06 ENCOUNTER — Ambulatory Visit
Admission: RE | Admit: 2018-07-06 | Discharge: 2018-07-06 | Disposition: A | Discharge status: Home / Self Care | Payer: BC Managed Care – PPO | Source: Ambulatory Visit | Attending: Nurse Practitioner | Admitting: Nurse Practitioner

## 2018-07-06 ENCOUNTER — Other Ambulatory Visit: Payer: Self-pay

## 2018-07-06 DIAGNOSIS — D484 Neoplasm of uncertain behavior of peritoneum: Secondary | ICD-10-CM | POA: Insufficient documentation

## 2018-07-06 DIAGNOSIS — D483 Neoplasm of uncertain behavior of retroperitoneum: Secondary | ICD-10-CM | POA: Diagnosis present

## 2018-07-06 MED ORDER — IOHEXOL 300 MG/ML  SOLN
100.0000 mL | Freq: Once | INTRAMUSCULAR | Status: AC | PRN
  Administered 2018-07-06: 100 mL via INTRAVENOUS

## 2018-07-09 ENCOUNTER — Ambulatory Visit: Payer: BC Managed Care – PPO | Admitting: Nurse Practitioner

## 2018-07-13 ENCOUNTER — Other Ambulatory Visit: Payer: Self-pay

## 2018-07-13 ENCOUNTER — Encounter: Payer: Self-pay | Admitting: Adult Health

## 2018-07-13 ENCOUNTER — Ambulatory Visit (INDEPENDENT_AMBULATORY_CARE_PROVIDER_SITE_OTHER): Payer: BC Managed Care – PPO | Admitting: Adult Health

## 2018-07-13 VITALS — BP 139/86 | HR 65 | Resp 16 | Ht 70.0 in | Wt 218.0 lb

## 2018-07-13 DIAGNOSIS — R7989 Other specified abnormal findings of blood chemistry: Secondary | ICD-10-CM | POA: Diagnosis not present

## 2018-07-13 DIAGNOSIS — N281 Cyst of kidney, acquired: Secondary | ICD-10-CM

## 2018-07-13 NOTE — Progress Notes (Signed)
[facility] [place], [place] [address]  Internal MEDICINE  Office Visit Note  Patient Name: Ricky Ross  [id number]  [id number]  Date of Service: 07/13/2018  Chief Complaint  Patient presents with  . Medical Management of Chronic Issues    follow up  . Labs Only    CT results    HPI Pt is here for follow up on CT results.  He had an [place] that showed a possible nodule in his left kidney.  Radiology recommended a CT, and that was done.  The CT showed "Very small low-density lesion in the LEFT kidney is favored benign although too small characterize".  Pt denies any issues at this time.      Current Medication: Outpatient Encounter Medications as of 07/13/2018  Medication Sig  . Multiple Vitamin (MULTI-VITAMINS) TABS Take by mouth.  . triamcinolone (KENALOG) 0.025 % cream Apply 1 application topically 2 (two) times daily.   No facility-administered encounter medications on file as of 07/13/2018.     Surgical History: Past Surgical History:  Procedure Laterality Date  . CARDIAC CATHETERIZATION N/A 03/22/2015   Procedure: Right/Left Heart Cath and Coronary Angiography;  Surgeon: Corey Skains, MD;  Location: [place] [facility];  Service: Cardiovascular;  Laterality: N/A;  . MITRAL VALVE REPAIR    . TEE WITHOUT CARDIOVERSION N/A 03/22/2015   Procedure: TRANSESOPHAGEAL ECHOCARDIOGRAM (TEE);  Surgeon: Corey Skains, MD;  Location: [facility];  Service: Cardiovascular;  Laterality: N/A;    Medical History: Past Medical History:  Diagnosis Date  . Heart murmur   . Mitral valve disorder     Family History: Family History  Family history unknown: Yes    Social History   Socioeconomic History  . Marital status: Married    Spouse name: Not on file  . Number of children: Not on file  . Years of education: Not on file  . Highest education level: Not on file  Occupational History  . Not on file  Social Needs  . Financial resource strain: Not  on file  . Food insecurity    Worry: Not on file    Inability: Not on file  . Transportation needs    Medical: Not on file    Non-medical: Not on file  Tobacco Use  . Smoking status: Never Smoker  . Smokeless tobacco: Never Used  Substance and Sexual Activity  . Alcohol use: No  . Drug use: No  . Sexual activity: Not on file  Lifestyle  . Physical activity    Days per week: Not on file    Minutes per session: Not on file  . Stress: Not on file  Relationships  . Social Herbalist on phone: Not on file    Gets together: Not on file    Attends religious service: Not on file    Active member of club or organization: Not on file    Attends meetings of clubs or organizations: Not on file    Relationship status: Not on file  . Intimate partner violence    Fear of current or ex partner: Not on file    Emotionally abused: Not on file    Physically abused: Not on file    Forced sexual activity: Not on file  Other Topics Concern  . Not on file  Social History Narrative  . Not on file      Review of Systems  Constitutional: Negative.  Negative for chills, fatigue and unexpected  weight change.  HENT: Negative.  Negative for congestion, rhinorrhea, sneezing and sore throat.   Eyes: Negative for redness.  Respiratory: Negative.  Negative for cough, chest tightness and shortness of breath.   Cardiovascular: Negative.  Negative for chest pain and palpitations.  Gastrointestinal: Negative.  Negative for abdominal pain, constipation, diarrhea, nausea and vomiting.  Endocrine: Negative.   Genitourinary: Negative.  Negative for dysuria and frequency.  Musculoskeletal: Negative.  Negative for arthralgias, back pain, joint swelling and neck pain.  Skin: Negative.  Negative for rash.  Allergic/Immunologic: Negative.   Neurological: Negative.  Negative for tremors and numbness.  Hematological: Negative for adenopathy. Does not bruise/bleed easily.  Psychiatric/Behavioral:  Negative.  Negative for behavioral problems, sleep disturbance and suicidal ideas. The patient is not nervous/anxious.     Vital Signs: BP 139/86 (BP Location: Left Arm, Patient Position: Sitting, Cuff Size: Normal)   Pulse 65   Resp 16   Ht 5\' 10"  (1.778 m)   Wt 218 lb (98.9 kg)   SpO2 99%   BMI 31.28 kg/m    Physical Exam Vitals signs and nursing note reviewed.  Constitutional:      General: He is not in acute distress.    Appearance: He is well-developed. He is not diaphoretic.  HENT:     Head: Normocephalic and atraumatic.     Mouth/Throat:     Pharynx: No oropharyngeal exudate.  Eyes:     Pupils: Pupils are equal, round, and reactive to light.  Neck:     Musculoskeletal: Normal range of motion and neck supple.     Thyroid: No thyromegaly.     Vascular: No JVD.     Trachea: No tracheal deviation.  Cardiovascular:     Rate and Rhythm: Normal rate and regular rhythm.     Heart sounds: Normal heart sounds. No murmur. No friction rub. No gallop.   Pulmonary:     Effort: Pulmonary effort is normal. No respiratory distress.     Breath sounds: Normal breath sounds. No wheezing or rales.  Chest:     Chest wall: No tenderness.  Abdominal:     Palpations: Abdomen is soft.     Tenderness: There is no abdominal tenderness. There is no guarding.  Musculoskeletal: Normal range of motion.  Lymphadenopathy:     Cervical: No cervical adenopathy.  Skin:    General: Skin is warm and dry.  Neurological:     Mental Status: He is alert and oriented to person, place, and time.     Cranial Nerves: No cranial nerve deficit.  Psychiatric:        Behavior: Behavior normal.        Thought Content: Thought content normal.        Judgment: Judgment normal.     Assessment/Plan: 1. Acquired renal cyst of left kidney Radiology feels like cyst found in kidney 9 will repeat scan 6 months to determine therapy changes.  2. Elevated serum creatinine Patient's creatinine is improving down  to 1.45.  We will follow-up in 6 months for repeat trending down at this time.  General Counseling: Ricky Ross verbalizes understanding of the findings of todays visit and agrees with plan of treatment. I have discussed any further diagnostic evaluation that may be needed or ordered today. We also reviewed his medications today. he has been encouraged to call the office with any questions or concerns that should arise related to todays visit.    No orders of the defined types were placed in this encounter.  No orders of the defined types were placed in this encounter.   Time spent: 20 Minutes   This patient was seen by Orson Gear AGNP-C in Collaboration with Dr Lavera Guise as a part of collaborative care agreement     Kendell Bane AGNP-C Internal medicine

## 2018-11-10 ENCOUNTER — Other Ambulatory Visit: Payer: Self-pay

## 2018-11-10 ENCOUNTER — Encounter: Payer: Self-pay | Admitting: Adult Health

## 2018-11-10 ENCOUNTER — Ambulatory Visit: Payer: BC Managed Care – PPO | Admitting: Adult Health

## 2018-11-10 ENCOUNTER — Telehealth: Payer: Self-pay

## 2018-11-10 VITALS — HR 85 | Temp 97.3°F | Resp 16 | Ht 70.0 in | Wt 224.0 lb

## 2018-11-10 DIAGNOSIS — K591 Functional diarrhea: Secondary | ICD-10-CM

## 2018-11-10 DIAGNOSIS — R197 Diarrhea, unspecified: Secondary | ICD-10-CM | POA: Diagnosis not present

## 2018-11-10 DIAGNOSIS — Z1211 Encounter for screening for malignant neoplasm of colon: Secondary | ICD-10-CM

## 2018-11-10 MED ORDER — METRONIDAZOLE 500 MG PO TABS
500.0000 mg | ORAL_TABLET | Freq: Two times a day (BID) | ORAL | 0 refills | Status: DC
Start: 2018-11-10 — End: 2018-12-22

## 2018-11-10 NOTE — Progress Notes (Addendum)
[facility] [place], [place] [address]  Internal MEDICINE  Office Visit Note  Patient Name: Ricky Ross  [id number]  [id number]  Date of Service: 12/09/2018  Chief Complaint  Patient presents with  . Gas/bloating and diarrhea     HPI Pt is here for a sick visit.  Pt reports 3 weeks of diarrhea and Gas.  He decided to stop drinking milk and switch to lactaid which helped some. He would like to see gastroenterology, however he is not opposed to attempting medication therapy while he waits for follow up.      Current Medication:  Outpatient Encounter Medications as of 11/10/2018  Medication Sig  . Multiple Vitamin (MULTI-VITAMINS) TABS Take by mouth.  . triamcinolone (KENALOG) 0.025 % cream Apply 1 application topically 2 (two) times daily.  . metroNIDAZOLE (FLAGYL) 500 MG tablet Take 1 tablet (500 mg total) by mouth 2 (two) times daily.   No facility-administered encounter medications on file as of 11/10/2018.    Past Surgical History:  Procedure Laterality Date  . CARDIAC CATHETERIZATION N/A 03/22/2015   Procedure: Right/Left Heart Cath and Coronary Angiography;  Surgeon: Corey Skains, MD;  Location: [place] [facility];  Service: Cardiovascular;  Laterality: N/A;  . COLONOSCOPY WITH PROPOFOL N/A 11/27/2018   Procedure: COLONOSCOPY WITH PROPOFOL;  Surgeon: Virgel Manifold, MD;  Location: [facility];  Service: Endoscopy;  Laterality: N/A;  . MITRAL VALVE REPAIR    . TEE WITHOUT CARDIOVERSION N/A 03/22/2015   Procedure: TRANSESOPHAGEAL ECHOCARDIOGRAM (TEE);  Surgeon: Corey Skains, MD;  Location: [facility];  Service: Cardiovascular;  Laterality: N/A;     Medical History: Past Medical History:  Diagnosis Date  . Heart murmur   . Mitral valve disorder      Vital Signs: Pulse 85   Temp (!) 97.3 F (36.3 C)   Resp 16   Ht 5\' 10"  (1.778 m)   Wt 224 lb (101.6 kg)   SpO2 98%   BMI 32.14 kg/m    Review of Systems   Constitutional: Negative.  Negative for chills, fatigue and unexpected weight change.  HENT: Negative.  Negative for congestion, rhinorrhea, sneezing and sore throat.   Eyes: Negative for redness.  Respiratory: Negative.  Negative for cough, chest tightness and shortness of breath.   Cardiovascular: Negative.  Negative for chest pain and palpitations.  Gastrointestinal: Negative.  Negative for abdominal pain, constipation, diarrhea, nausea and vomiting.  Endocrine: Negative.   Genitourinary: Negative.  Negative for dysuria and frequency.  Musculoskeletal: Negative.  Negative for arthralgias, back pain, joint swelling and neck pain.  Skin: Negative.  Negative for rash.  Allergic/Immunologic: Negative.   Neurological: Negative.  Negative for tremors and numbness.  Hematological: Negative for adenopathy. Does not bruise/bleed easily.  Psychiatric/Behavioral: Negative.  Negative for behavioral problems, sleep disturbance and suicidal ideas. The patient is not nervous/anxious.     Physical Exam Vitals signs and nursing note reviewed.  Constitutional:      General: He is not in acute distress.    Appearance: He is well-developed. He is not diaphoretic.  HENT:     Head: Normocephalic and atraumatic.     Mouth/Throat:     Pharynx: No oropharyngeal exudate.  Eyes:     Pupils: Pupils are equal, round, and reactive to light.  Neck:     Musculoskeletal: Normal range of motion and neck supple.     Thyroid: No thyromegaly.     Vascular: No JVD.     Trachea:  No tracheal deviation.  Cardiovascular:     Rate and Rhythm: Normal rate and regular rhythm.     Heart sounds: Normal heart sounds. No murmur. No friction rub. No gallop.   Pulmonary:     Effort: Pulmonary effort is normal. No respiratory distress.     Breath sounds: Normal breath sounds. No wheezing or rales.  Chest:     Chest wall: No tenderness.  Abdominal:     Palpations: Abdomen is soft.     Tenderness: There is no abdominal  tenderness. There is no guarding.  Musculoskeletal: Normal range of motion.  Lymphadenopathy:     Cervical: No cervical adenopathy.  Skin:    General: Skin is warm and dry.  Neurological:     Mental Status: He is alert and oriented to person, place, and time.     Cranial Nerves: No cranial nerve deficit.  Psychiatric:        Behavior: Behavior normal.        Thought Content: Thought content normal.        Judgment: Judgment normal.    Assessment/Plan: 1. Diarrhea of presumed infections origin Advised patient to take entire course of antibiotics as prescribed with food. Pt should return to clinic in 7-10 days if symptoms fail to improve or new symptoms develop.  - metroNIDAZOLE (FLAGYL) 500 MG tablet; Take 1 tablet (500 mg total) by mouth 2 (two) times daily.  Dispense: 14 tablet; Refill: 0  2. Screening for colon cancer - Ambulatory referral to Gastroenterology  General Counseling: Bertie verbalizes understanding of the findings of todays visit and agrees with plan of treatment. I have discussed any further diagnostic evaluation that may be needed or ordered today. We also reviewed his medications today. he has been encouraged to call the office with any questions or concerns that should arise related to todays visit.   Orders Placed This Encounter  Procedures  . Ambulatory referral to Gastroenterology    Meds ordered this encounter  Medications  . metroNIDAZOLE (FLAGYL) 500 MG tablet    Sig: Take 1 tablet (500 mg total) by mouth 2 (two) times daily.    Dispense:  14 tablet    Refill:  0    Time spent: 25 Minutes  This patient was seen by Orson Gear AGNP-C in Collaboration with Dr Lavera Guise as a part of collaborative care agreement.  Kendell Bane AGNP-C Internal Medicine

## 2018-11-10 NOTE — Telephone Encounter (Signed)
Gastroenterology Pre-Procedure Review  Request Date: DATE PENDING Requesting Physician:  PATIENT REVIEW QUESTIONS: The patient responded to the following health history questions as indicated:    1. Are you having any GI issues? yes (GAS ISSUES) 2. Do you have a personal history of Polyps? no 3. Do you have a family history of Colon Cancer or Polyps? no 4. Diabetes Mellitus? no 5. Joint replacements in the past 12 months?no 6. Major health problems in the past 3 months?no 7. Any artificial heart valves, MVP, or defibrillator?HEART SURGERY 3 YEARS AGO, AND PULMONARY HEALTH     MEDICATIONS & ALLERGIES:    Patient reports the following regarding taking any anticoagulation/antiplatelet therapy:   Plavix, Coumadin, Eliquis, Xarelto, Lovenox, Pradaxa, Brilinta, or Effient? no Aspirin? no  Patient confirms/reports the following medications:  Current Outpatient Medications  Medication Sig Dispense Refill  . metroNIDAZOLE (FLAGYL) 500 MG tablet Take 1 tablet (500 mg total) by mouth 2 (two) times daily. 14 tablet 0  . Multiple Vitamin (MULTI-VITAMINS) TABS Take by mouth.    . triamcinolone (KENALOG) 0.025 % cream Apply 1 application topically 2 (two) times daily. 80 g 2   No current facility-administered medications for this visit.     Patient confirms/reports the following allergies:  Allergies  Allergen Reactions  . Lac Bovis Hives    Whole  . Milk-Related Compounds Hives    No orders of the defined types were placed in this encounter.   AUTHORIZATION INFORMATION Primary Insurance: 1D#: Group #:  Secondary Insurance: 1D#: Group #:  SCHEDULE INFORMATION: Date:  Time: Location:

## 2018-11-11 ENCOUNTER — Telehealth: Payer: Self-pay | Admitting: Gastroenterology

## 2018-11-11 NOTE — Telephone Encounter (Signed)
Pt left vm for Sharyn Lull to schedule a colonoscopy for Next Friday please call pt

## 2018-11-11 NOTE — Telephone Encounter (Signed)
Patients call has been returned.  He states he will need to call back to schedule due to the date provided is his wife's bday.  Thanks [employer]

## 2018-11-12 ENCOUNTER — Other Ambulatory Visit: Payer: Self-pay

## 2018-11-12 DIAGNOSIS — Z1211 Encounter for screening for malignant neoplasm of colon: Secondary | ICD-10-CM

## 2018-11-12 MED ORDER — NA SULFATE-K SULFATE-MG SULF 17.5-3.13-1.6 GM/177ML PO SOLN: 1.0000 | Freq: Once | ORAL | 0 refills | Status: AC

## 2018-11-23 ENCOUNTER — Telehealth: Payer: Self-pay

## 2018-11-23 NOTE — Telephone Encounter (Signed)
Called patient to confirm patient procedure appointment on 11/27/2018 and to remind patient that they have to go for a COVID test on 11/24/2018.  Left a detail message explaining this and sent a mychart message

## 2018-11-24 ENCOUNTER — Other Ambulatory Visit: Payer: Self-pay

## 2018-11-24 ENCOUNTER — Other Ambulatory Visit
Admission: RE | Admit: 2018-11-24 | Discharge: 2018-11-24 | Disposition: A | Discharge status: Home / Self Care | Payer: BC Managed Care – PPO | Source: Ambulatory Visit | Attending: Gastroenterology | Admitting: Gastroenterology

## 2018-11-24 DIAGNOSIS — Z20828 Contact with and (suspected) exposure to other viral communicable diseases: Secondary | ICD-10-CM | POA: Insufficient documentation

## 2018-11-24 DIAGNOSIS — Z01812 Encounter for preprocedural laboratory examination: Secondary | ICD-10-CM | POA: Insufficient documentation

## 2018-11-24 LAB — SARS CORONAVIRUS 2 (TAT 6-24 HRS): SARS Coronavirus 2: NEGATIVE

## 2018-11-27 ENCOUNTER — Ambulatory Visit
Admission: RE | Admit: 2018-11-27 | Discharge: 2018-11-27 | Disposition: A | Discharge status: Home / Self Care | Payer: BC Managed Care – PPO | Attending: Gastroenterology | Admitting: Gastroenterology

## 2018-11-27 ENCOUNTER — Ambulatory Visit: Payer: BC Managed Care – PPO | Admitting: Anesthesiology

## 2018-11-27 ENCOUNTER — Encounter: Payer: Self-pay | Admitting: *Deleted

## 2018-11-27 ENCOUNTER — Encounter
Admission: RE | Disposition: A | Discharge status: Home / Self Care | Payer: Self-pay | Source: Home / Self Care | Attending: Gastroenterology

## 2018-11-27 DIAGNOSIS — Z91011 Allergy to milk products: Secondary | ICD-10-CM | POA: Diagnosis not present

## 2018-11-27 DIAGNOSIS — D122 Benign neoplasm of ascending colon: Secondary | ICD-10-CM | POA: Insufficient documentation

## 2018-11-27 DIAGNOSIS — E669 Obesity, unspecified: Secondary | ICD-10-CM | POA: Insufficient documentation

## 2018-11-27 DIAGNOSIS — Z79899 Other long term (current) drug therapy: Secondary | ICD-10-CM | POA: Diagnosis not present

## 2018-11-27 DIAGNOSIS — K635 Polyp of colon: Secondary | ICD-10-CM | POA: Diagnosis not present

## 2018-11-27 DIAGNOSIS — Z1211 Encounter for screening for malignant neoplasm of colon: Secondary | ICD-10-CM | POA: Diagnosis present

## 2018-11-27 DIAGNOSIS — Z6830 Body mass index (BMI) 30.0-30.9, adult: Secondary | ICD-10-CM | POA: Diagnosis not present

## 2018-11-27 DIAGNOSIS — R011 Cardiac murmur, unspecified: Secondary | ICD-10-CM | POA: Insufficient documentation

## 2018-11-27 HISTORY — PX: COLONOSCOPY WITH PROPOFOL: SHX5780

## 2018-11-27 SURGERY — COLONOSCOPY WITH PROPOFOL
Anesthesia: General

## 2018-11-27 MED ORDER — LIDOCAINE HCL (CARDIAC) PF 100 MG/5ML IV SOSY
PREFILLED_SYRINGE | INTRAVENOUS | Status: DC | PRN
  Administered 2018-11-27: 60 mg via INTRAVENOUS

## 2018-11-27 MED ORDER — SODIUM CHLORIDE 0.9 % IV SOLN
INTRAVENOUS | Status: DC
  Administered 2018-11-27: 1000 mL via INTRAVENOUS

## 2018-11-27 MED ORDER — LIDOCAINE HCL (PF) 2 % IJ SOLN
INTRAMUSCULAR | Status: AC
  Filled 2018-11-27: qty 10

## 2018-11-27 MED ORDER — PROPOFOL 500 MG/50ML IV EMUL
INTRAVENOUS | Status: DC | PRN
  Administered 2018-11-27: 100 ug/kg/min via INTRAVENOUS

## 2018-11-27 MED ORDER — PROPOFOL 10 MG/ML IV BOLUS
INTRAVENOUS | Status: DC | PRN
  Administered 2018-11-27: 20 mg via INTRAVENOUS
  Administered 2018-11-27: 50 mg via INTRAVENOUS

## 2018-11-27 MED ORDER — PROPOFOL 500 MG/50ML IV EMUL
INTRAVENOUS | Status: AC
  Filled 2018-11-27: qty 50

## 2018-11-27 NOTE — Anesthesia Post-op Follow-up Note (Signed)
Anesthesia QCDR form completed.        

## 2018-11-27 NOTE — Transfer of Care (Signed)
Immediate Anesthesia Transfer of Care Note  Patient: Ricky Ross  Procedure(s) Performed: COLONOSCOPY WITH PROPOFOL (N/A )  Patient Location: PACU  Anesthesia Type:General  Level of Consciousness: drowsy  Airway & Oxygen Therapy: Patient Spontanous Breathing and Patient connected to nasal cannula oxygen  Post-op Assessment: Report given to RN and Post -op Vital signs reviewed and stable  Post vital signs: Reviewed and stable  Last Vitals:  Vitals Value Taken Time  BP 94/68 11/27/18 1146  Temp    Pulse 78 11/27/18 1146  Resp 18 11/27/18 1146  SpO2 96 % 11/27/18 1146    Last Pain:  Vitals:   11/27/18 1146  TempSrc:   PainSc: (P) Asleep         Complications: No apparent anesthesia complications

## 2018-11-27 NOTE — Op Note (Signed)
[facility] Patient Name: Ricky Ross Procedure Date: 11/27/2018 11:08 AM MRN: [id number] Account #: [id number] Date of Birth: December 12, 1970 Admit Type: Outpatient Age: 48 Room: [facility] ENDO ROOM 2 Gender: Male Note Status: Finalized Procedure:            Colonoscopy Indications:          Screening for colorectal malignant neoplasm Providers:            Jamaree Hosier B. Bonna Gains MD, MD Medicines:            Monitored Anesthesia Care Complications:        No immediate complications. Procedure:            Pre-Anesthesia Assessment:                       - ASA Grade Assessment: II - A patient with mild                        systemic disease.                       - Prior to the procedure, a History and Physical was                        performed, and patient medications, allergies and                        sensitivities were reviewed. The patient's tolerance of                        previous anesthesia was reviewed.                       - The risks and benefits of the procedure and the                        sedation options and risks were discussed with the                        patient. All questions were answered and informed                        consent was obtained.                       - Patient identification and proposed procedure were                        verified prior to the procedure by the physician, the                        nurse, the anesthesiologist, the anesthetist and the                        technician. The procedure was verified in the procedure                        room.                       After obtaining informed consent, the colonoscope was  passed under direct vision. Throughout the procedure,                        the patient's blood pressure, pulse, and oxygen                        saturations were monitored continuously. The                        Colonoscope was introduced through the  anus and                        advanced to the the cecum, identified by appendiceal                        orifice and ileocecal valve. The colonoscopy was                        performed with ease. The patient tolerated the                        procedure well. The quality of the bowel preparation                        was good. Findings:      The perianal and digital rectal examinations were normal.      A 4 mm polyp was found in the ascending colon. The polyp was sessile.       The polyp was removed with a cold biopsy forceps. Resection and       retrieval were complete.      The exam was otherwise without abnormality.      The rectum, sigmoid colon, descending colon, transverse colon, ascending       colon and cecum appeared normal.      The retroflexed view of the distal rectum and anal verge was normal and       showed no anal or rectal abnormalities. Impression:           - One 4 mm polyp in the ascending colon, removed with a                        cold biopsy forceps. Resected and retrieved.                       - The examination was otherwise normal.                       - The rectum, sigmoid colon, descending colon,                        transverse colon, ascending colon and cecum are normal.                       - The distal rectum and anal verge are normal on                        retroflexion view. Recommendation:       - Discharge patient to home (with escort).                       -  Advance diet as tolerated.                       - Continue present medications.                       - Await pathology results.                       - Repeat colonoscopy in 5 years if polyps show adenoma,                        10 years if they are hyperplastic.                       - The findings and recommendations were discussed with                        the patient.                       - The findings and recommendations were discussed with                        the  patient's family.                       - Return to primary care physician as previously                        scheduled. Procedure Code(s):    --- Professional ---                       [phone], Colonoscopy, flexible; with biopsy, single or                        multiple Diagnosis Code(s):    --- Professional ---                       Z12.11, Encounter for screening for malignant neoplasm                        of colon                       K63.5, Polyp of colon CPT copyright 2019 American Medical Association. All rights reserved. The codes documented in this report are preliminary and upon coder review may  be revised to meet current compliance requirements.  Vonda Antigua, MD Margretta Sidle B. Bonna Gains MD, MD 11/27/2018 11:44:47 AM This report has been signed electronically. Number of Addenda: 0 Note Initiated On: 11/27/2018 11:08 AM Scope Withdrawal Time: 0 hours 13 minutes 43 seconds  Total Procedure Duration: 0 hours 16 minutes 40 seconds  Estimated Blood Loss: Estimated blood loss: none.      [facility]

## 2018-11-27 NOTE — Anesthesia Postprocedure Evaluation (Signed)
Anesthesia Post Note  Patient: Ricky Ross  Procedure(s) Performed: COLONOSCOPY WITH PROPOFOL (N/A )  Patient location during evaluation: Endoscopy Anesthesia Type: General Level of consciousness: awake and alert and oriented Pain management: pain level controlled Vital Signs Assessment: post-procedure vital signs reviewed and stable Respiratory status: spontaneous breathing, nonlabored ventilation and respiratory function stable Cardiovascular status: blood pressure returned to baseline and stable Postop Assessment: no signs of nausea or vomiting Anesthetic complications: no     Last Vitals:  Vitals:   11/27/18 1156 11/27/18 1206  BP: 110/76 126/88  Pulse: 75 68  Resp: (!) 32 20  Temp:    SpO2: 97% 97%    Last Pain:  Vitals:   11/27/18 1206  TempSrc:   PainSc: 0-No pain                 Jakye Mullens

## 2018-11-27 NOTE — Anesthesia Preprocedure Evaluation (Signed)
Anesthesia Evaluation  Patient identified by MRN, date of birth, ID band Patient awake    Reviewed: Allergy & Precautions, NPO status , Patient's Chart, lab work & pertinent test results  History of Anesthesia Complications Negative for: history of anesthetic complications  Airway Mallampati: II  TM Distance: >3 FB Neck ROM: Full    Dental no notable dental hx.    Pulmonary neg pulmonary ROS, neg sleep apnea, neg COPD,    breath sounds clear to auscultation- rhonchi (-) wheezing      Cardiovascular (-) hypertension(-) CAD, (-) Past MI, (-) Cardiac Stents and (-) CABG + Valvular Problems/Murmurs (s/p MVR)  Rhythm:Regular Rate:Normal - Systolic murmurs and - Diastolic murmurs    Neuro/Psych neg Seizures negative neurological ROS  negative psych ROS   GI/Hepatic negative GI ROS, Neg liver ROS,   Endo/Other  negative endocrine ROSneg diabetes  Renal/GU negative Renal ROS     Musculoskeletal negative musculoskeletal ROS (+)   Abdominal (+) + obese,   Peds  Hematology negative hematology ROS (+)   Anesthesia Other Findings Past Medical History: No date: Heart murmur No date: Mitral valve disorder   Reproductive/Obstetrics                             Anesthesia Physical Anesthesia Plan  ASA: II  Anesthesia Plan: General   Post-op Pain Management:    Induction: Intravenous  PONV Risk Score and Plan: 1 and Propofol infusion  Airway Management Planned: Natural Airway  Additional Equipment:   Intra-op Plan:   Post-operative Plan:   Informed Consent: I have reviewed the patients History and Physical, chart, labs and discussed the procedure including the risks, benefits and alternatives for the proposed anesthesia with the patient or authorized representative who has indicated his/her understanding and acceptance.     Dental advisory given  Plan Discussed with: CRNA and  Anesthesiologist  Anesthesia Plan Comments:         Anesthesia Quick Evaluation

## 2018-11-27 NOTE — H&P (Signed)
Vonda Antigua, MD [address] [place], [place], [place], [address] [address], [place], [place], [place], [address] Phone: [phone]  Fax: [phone]  Primary Care Physician:  Lavera Guise, MD   Pre-Procedure History & Physical: HPI:  Ricky Ross is a 48 y.o. male is here for a colonoscopy.   Past Medical History:  Diagnosis Date  . Heart murmur   . Mitral valve disorder     Past Surgical History:  Procedure Laterality Date  . CARDIAC CATHETERIZATION N/A 03/22/2015   Procedure: Right/Left Heart Cath and Coronary Angiography;  Surgeon: Corey Skains, MD;  Location: [facility];  Service: Cardiovascular;  Laterality: N/A;  . MITRAL VALVE REPAIR    . TEE WITHOUT CARDIOVERSION N/A 03/22/2015   Procedure: TRANSESOPHAGEAL ECHOCARDIOGRAM (TEE);  Surgeon: Corey Skains, MD;  Location: [facility];  Service: Cardiovascular;  Laterality: N/A;    Prior to Admission medications   Medication Sig Start Date End Date Taking? Authorizing Provider  metroNIDAZOLE (FLAGYL) 500 MG tablet Take 1 tablet (500 mg total) by mouth 2 (two) times daily. 11/10/18   Kendell Bane, NP  Multiple Vitamin (MULTI-VITAMINS) TABS Take by mouth.    [provider]  triamcinolone (KENALOG) 0.025 % cream Apply 1 application topically 2 (two) times daily. 06/18/18   Ronnell Freshwater, NP    Allergies as of 11/12/2018 - Review Complete 11/10/2018  Allergen Reaction Noted  . 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 Milk-related compounds Hives 03/22/2015    Family History  Family history unknown: Yes    Social History   Socioeconomic History  . Marital status: Married    Spouse name: Not on file  . Number of children: Not on file  . Years of education: Not on file  . Highest education level: Not on file  Occupational History  . Not on file  Social Needs  . Financial resource strain: Not on file  . Food insecurity    Worry: Not on file    Inability: Not on file  .  Transportation needs    Medical: Not on file    Non-medical: Not on file  Tobacco Use  . Smoking status: Never Smoker  . Smokeless tobacco: Never Used  Substance and Sexual Activity  . Alcohol use: No  . Drug use: No  . Sexual activity: Not on file  Lifestyle  . Physical activity    Days per week: Not on file    Minutes per session: Not on file  . Stress: Not on file  Relationships  . Social Herbalist on phone: Not on file    Gets together: Not on file    Attends religious service: Not on file    Active member of club or organization: Not on file    Attends meetings of clubs or organizations: Not on file    Relationship status: Not on file  . Intimate partner violence    Fear of current or ex partner: Not on file    Emotionally abused: Not on file    Physically abused: Not on file    Forced sexual activity: Not on file  Other Topics Concern  . Not on file  Social History Narrative  . Not on file    Review of Systems: See HPI, otherwise negative ROS  Physical Exam: BP 127/81   Pulse 72   Temp (!) 97.5 F (36.4 C) (Tympanic)   Resp 16   Ht 5\' 10"  (1.778 m)  Wt 95.3 kg   SpO2 99%   BMI 30.13 kg/m  General:   Alert,  pleasant and cooperative in NAD Head:  Normocephalic and atraumatic. Neck:  Supple; no masses or thyromegaly. Lungs:  Clear throughout to auscultation, normal respiratory effort.    Heart:  +S1, +S2, Regular rate and rhythm, No edema. Abdomen:  Soft, nontender and nondistended. Normal bowel sounds, without guarding, and without rebound.   Neurologic:  Alert and  oriented x4;  grossly normal neurologically.  Impression/Plan: Ricky Ross is here for a colonoscopy to be performed for average risk screening.  Risks, benefits, limitations, and alternatives regarding  colonoscopy have been reviewed with the patient.  Questions have been answered.  All parties agreeable.   Virgel Manifold, MD  11/27/2018, 11:19 AM

## 2018-11-30 ENCOUNTER — Encounter: Payer: Self-pay | Admitting: Gastroenterology

## 2018-11-30 LAB — SURGICAL PATHOLOGY

## 2018-12-02 ENCOUNTER — Encounter: Payer: Self-pay | Admitting: Gastroenterology

## 2018-12-07 NOTE — Addendum Note (Signed)
Addended by: Lavera Guise on: 12/07/2018 06:53 PM   Modules accepted: Level of Service

## 2018-12-18 ENCOUNTER — Telehealth: Payer: Self-pay

## 2018-12-18 NOTE — Telephone Encounter (Signed)
Confirmed appointment with patient. klh °

## 2018-12-22 ENCOUNTER — Encounter: Payer: Self-pay | Admitting: Adult Health

## 2018-12-22 ENCOUNTER — Other Ambulatory Visit: Payer: Self-pay

## 2018-12-22 ENCOUNTER — Ambulatory Visit: Payer: BC Managed Care – PPO | Admitting: Adult Health

## 2018-12-22 VITALS — BP 127/83 | HR 78 | Temp 97.0°F | Resp 16 | Ht 70.0 in | Wt 222.0 lb

## 2018-12-22 DIAGNOSIS — R197 Diarrhea, unspecified: Secondary | ICD-10-CM | POA: Diagnosis not present

## 2018-12-22 DIAGNOSIS — Z23 Encounter for immunization: Secondary | ICD-10-CM

## 2018-12-22 DIAGNOSIS — R141 Gas pain: Secondary | ICD-10-CM | POA: Diagnosis not present

## 2018-12-22 NOTE — Progress Notes (Signed)
[facility] [place], [place] [address]  Internal MEDICINE  Office Visit Note  Patient Name: Ricky Ross  [id number]  [id number]  Date of Service: 12/22/2018  Chief Complaint  Patient presents with  . Medical Management of Chronic Issues    6 week follow up , colonoscopy results     HPI  Pt is here for follow up.  He reports his diarrhea has essentially resolved. He does continue to have gas. He had a colonoscopy. There was one polyp that was found to be benign.   He is experimenting with his diet to see if he can eliminate the gas.  He believes the food at his work is the major cause of his gas and discomfort. He has been slowly eliminating things like milk, to see if he can find a source. So far he continues to search.     Current Medication: Outpatient Encounter Medications as of 12/22/2018  Medication Sig  . Multiple Vitamin (MULTI-VITAMINS) TABS Take by mouth.  . triamcinolone (KENALOG) 0.025 % cream Apply 1 application topically 2 (two) times daily.  . [DISCONTINUED] metroNIDAZOLE (FLAGYL) 500 MG tablet Take 1 tablet (500 mg total) by mouth 2 (two) times daily. (Patient not taking: Reported on 12/22/2018)   No facility-administered encounter medications on file as of 12/22/2018.     Surgical History: Past Surgical History:  Procedure Laterality Date  . CARDIAC CATHETERIZATION N/A 03/22/2015   Procedure: Right/Left Heart Cath and Coronary Angiography;  Surgeon: Corey Skains, MD;  Location: [facility];  Service: Cardiovascular;  Laterality: N/A;  . COLONOSCOPY WITH PROPOFOL N/A 11/27/2018   Procedure: COLONOSCOPY WITH PROPOFOL;  Surgeon: Virgel Manifold, MD;  Location: [facility];  Service: Endoscopy;  Laterality: N/A;  . MITRAL VALVE REPAIR    . TEE WITHOUT CARDIOVERSION N/A 03/22/2015   Procedure: TRANSESOPHAGEAL ECHOCARDIOGRAM (TEE);  Surgeon: Corey Skains, MD;  Location: [facility];  Service: Cardiovascular;  Laterality:  N/A;    Medical History: Past Medical History:  Diagnosis Date  . Heart murmur   . Mitral valve disorder     Family History: Family History  Family history unknown: Yes    Social History   Socioeconomic History  . Marital status: Married    Spouse name: Not on file  . Number of children: Not on file  . Years of education: Not on file  . Highest education level: Not on file  Occupational History  . Not on file  Social Needs  . Financial resource strain: Not on file  . Food insecurity    Worry: Not on file    Inability: Not on file  . Transportation needs    Medical: Not on file    Non-medical: Not on file  Tobacco Use  . Smoking status: Never Smoker  . Smokeless tobacco: Never Used  Substance and Sexual Activity  . Alcohol use: No  . Drug use: No  . Sexual activity: Not on file  Lifestyle  . Physical activity    Days per week: Not on file    Minutes per session: Not on file  . Stress: Not on file  Relationships  . Social Herbalist on phone: Not on file    Gets together: Not on file    Attends religious service: Not on file    Active member of club or organization: Not on file    Attends meetings of clubs or organizations: Not on file    Relationship  status: Not on file  . Intimate partner violence    Fear of current or ex partner: Not on file    Emotionally abused: Not on file    Physically abused: Not on file    Forced sexual activity: Not on file  Other Topics Concern  . Not on file  Social History Narrative  . Not on file      Review of Systems  Constitutional: Negative.  Negative for chills, fatigue and unexpected weight change.  HENT: Negative.  Negative for congestion, rhinorrhea, sneezing and sore throat.   Eyes: Negative for redness.  Respiratory: Negative.  Negative for cough, chest tightness and shortness of breath.   Cardiovascular: Negative.  Negative for chest pain and palpitations.  Gastrointestinal: Negative.  Negative  for abdominal pain, constipation, diarrhea, nausea and vomiting.  Endocrine: Negative.   Genitourinary: Negative.  Negative for dysuria and frequency.  Musculoskeletal: Negative.  Negative for arthralgias, back pain, joint swelling and neck pain.  Skin: Negative.  Negative for rash.  Allergic/Immunologic: Negative.   Neurological: Negative.  Negative for tremors and numbness.  Hematological: Negative for adenopathy. Does not bruise/bleed easily.  Psychiatric/Behavioral: Negative.  Negative for behavioral problems, sleep disturbance and suicidal ideas. The patient is not nervous/anxious.     Vital Signs: BP 127/83   Pulse 78   Temp (!) 97 F (36.1 C)   Resp 16   Ht 5\' 10"  (1.778 m)   Wt 222 lb (100.7 kg)   SpO2 98%   BMI 31.85 kg/m    Physical Exam Vitals signs and nursing note reviewed.  Constitutional:      General: He is not in acute distress.    Appearance: He is well-developed. He is not diaphoretic.  HENT:     Head: Normocephalic and atraumatic.     Mouth/Throat:     Pharynx: No oropharyngeal exudate.  Eyes:     Pupils: Pupils are equal, round, and reactive to light.  Neck:     Musculoskeletal: Normal range of motion and neck supple.     Thyroid: No thyromegaly.     Vascular: No JVD.     Trachea: No tracheal deviation.  Cardiovascular:     Rate and Rhythm: Normal rate and regular rhythm.     Heart sounds: Normal heart sounds. No murmur. No friction rub. No gallop.   Pulmonary:     Effort: Pulmonary effort is normal. No respiratory distress.     Breath sounds: Normal breath sounds. No wheezing or rales.  Chest:     Chest wall: No tenderness.  Abdominal:     Palpations: Abdomen is soft.     Tenderness: There is no abdominal tenderness. There is no guarding.  Musculoskeletal: Normal range of motion.  Lymphadenopathy:     Cervical: No cervical adenopathy.  Skin:    General: Skin is warm and dry.  Neurological:     Mental Status: He is alert and oriented to  person, place, and time.     Cranial Nerves: No cranial nerve deficit.  Psychiatric:        Behavior: Behavior normal.        Thought Content: Thought content normal.        Judgment: Judgment normal.    Assessment/Plan: 1. Abdominal gas pain Try OTC medication with diet modification. Encouraged patient to  keep food journal. Will follow up as scheduled in january.   2. Diarrhea, unspecified type Resolved, continue to monitor.   3. Flu vaccine need - Flu Vaccine  MDCK QUAD PF  General Counseling: Jatinder verbalizes understanding of the findings of todays visit and agrees with plan of treatment. I have discussed any further diagnostic evaluation that may be needed or ordered today. We also reviewed his medications today. he has been encouraged to call the office with any questions or concerns that should arise related to todays visit.    Orders Placed This Encounter  Procedures  . Flu Vaccine MDCK QUAD PF    No orders of the defined types were placed in this encounter.   Time spent: 30 Minutes   This patient was seen by Orson Gear AGNP-C in Collaboration with Dr Lavera Guise as a part of collaborative care agreement     Kendell Bane AGNP-C Internal medicine

## 2018-12-31 ENCOUNTER — Telehealth: Payer: Self-pay | Admitting: Gastroenterology

## 2018-12-31 ENCOUNTER — Telehealth: Payer: Self-pay

## 2018-12-31 NOTE — Telephone Encounter (Signed)
Please advised. Patient states he really does not have abdominal pain just gas and bloating

## 2018-12-31 NOTE — Telephone Encounter (Signed)
Pt had a procedure with Dr. Fontaine No on 11/27/18 he has been having gas and bloating his PCP did give him medicine for it that is not helping, He wanted to schedule an apt with Dr. Bonna Gains , but we do not have any apt available. Please call pt for advise

## 2018-12-31 NOTE — Telephone Encounter (Signed)
Confirmed appointment with patient. klh °

## 2019-01-04 ENCOUNTER — Encounter: Payer: Self-pay | Admitting: Adult Health

## 2019-01-04 ENCOUNTER — Ambulatory Visit: Payer: BC Managed Care – PPO | Admitting: Adult Health

## 2019-01-04 ENCOUNTER — Other Ambulatory Visit: Payer: Self-pay

## 2019-01-04 VITALS — BP 125/76 | HR 78 | Temp 96.9°F | Resp 16 | Ht 70.0 in | Wt 223.0 lb

## 2019-01-04 DIAGNOSIS — R141 Gas pain: Secondary | ICD-10-CM

## 2019-01-04 DIAGNOSIS — R197 Diarrhea, unspecified: Secondary | ICD-10-CM | POA: Diagnosis not present

## 2019-01-04 NOTE — Progress Notes (Signed)
[facility] [place], [place] [id number]  Internal MEDICINE  Office Visit Note  Patient Name: Ricky Ross   [id number]  [id number]  Date of Service: 01/04/2019  Chief Complaint  Patient presents with  . Medical Management of Chronic Issues    needs referral to gi   . Gas    HPI Patient is here to follow-up on abdominal bloating and gas. Recently had a colonoscopy 10/30 which revealed benign polyp that was removed. Abdominal bloating and gas has been ongoing since before colonoscopy, has also had intermittent days of diarrhea. Reports having soft, watery stools in the morning which remains soft but less watery throughout the day. Has tried eliminating a variety of foods from his diet and has been taking OTC gas medication with minimum symptoms relief. Discussed possibility that multi-vitamin may be the culprit as he has had issues with heartburn from a different multi-vitamin in the past and started this new multi-vitamin a few months ago when his GI symptoms started.  He tried seeing GI that did colonoscopy, however they will need another referral.   Current Medication: Outpatient Encounter Medications as of 01/04/2019  Medication Sig  . Multiple Vitamin (MULTI-VITAMINS) TABS Take by mouth.  . triamcinolone (KENALOG) 0.025 % cream Apply 1 application topically 2 (two) times daily.   No facility-administered encounter medications on file as of 01/04/2019.     Surgical History: Past Surgical History:  Procedure Laterality Date  . CARDIAC CATHETERIZATION N/A 03/22/2015   Procedure: Right/Left Heart Cath and Coronary Angiography;  Surgeon: Corey Skains, MD;  Location: [facility];  Service: Cardiovascular;  Laterality: N/A;  . COLONOSCOPY WITH PROPOFOL N/A 11/27/2018   Procedure: COLONOSCOPY WITH PROPOFOL;  Surgeon: Virgel Manifold, MD;  Location: [facility];  Service: Endoscopy;  Laterality: N/A;  . MITRAL VALVE REPAIR    . TEE WITHOUT  CARDIOVERSION N/A 03/22/2015   Procedure: TRANSESOPHAGEAL ECHOCARDIOGRAM (TEE);  Surgeon: Corey Skains, MD;  Location: [facility];  Service: Cardiovascular;  Laterality: N/A;    Medical History: Past Medical History:  Diagnosis Date  . Heart murmur   . Mitral valve disorder     Family History: Family History  Family history unknown: Yes    Social History   Socioeconomic History  . Marital status: Married    Spouse name: Not on file  . Number of children: Not on file  . Years of education: Not on file  . Highest education level: Not on file  Occupational History  . Not on file  Social Needs  . Financial resource strain: Not on file  . Food insecurity    Worry: Not on file    Inability: Not on file  . Transportation needs    Medical: Not on file    Non-medical: Not on file  Tobacco Use  . Smoking status: Never Smoker  . Smokeless tobacco: Never Used  Substance and Sexual Activity  . Alcohol use: No  . Drug use: No  . Sexual activity: Not on file  Lifestyle  . Physical activity    Days per week: Not on file    Minutes per session: Not on file  . Stress: Not on file  Relationships  . Social Herbalist on phone: Not on file    Gets together: Not on file    Attends religious service: Not on file    Active member of club or organization: Not on file    Attends meetings of  clubs or organizations: Not on file    Relationship status: Not on file  . Intimate partner violence    Fear of current or ex partner: Not on file    Emotionally abused: Not on file    Physically abused: Not on file    Forced sexual activity: Not on file  Other Topics Concern  . Not on file  Social History Narrative  . Not on file      Review of Systems  Constitutional: Negative.  Negative for chills, fatigue and unexpected weight change.  HENT: Negative.  Negative for congestion, rhinorrhea, sneezing and sore throat.   Eyes: Negative for redness.  Respiratory: Negative.   Negative for cough, chest tightness and shortness of breath.   Cardiovascular: Negative.  Negative for chest pain and palpitations.  Gastrointestinal: Positive for abdominal distention and diarrhea. Negative for abdominal pain, constipation, nausea and vomiting.       Gas pain  Endocrine: Negative.   Genitourinary: Negative.  Negative for dysuria and frequency.  Musculoskeletal: Negative.  Negative for arthralgias, back pain, joint swelling and neck pain.  Skin: Negative.  Negative for rash.  Allergic/Immunologic: Negative.   Neurological: Negative.  Negative for tremors and numbness.  Hematological: Negative for adenopathy. Does not bruise/bleed easily.  Psychiatric/Behavioral: Negative.  Negative for behavioral problems, sleep disturbance and suicidal ideas. The patient is not nervous/anxious.     Vital Signs: BP 125/76   Pulse 78   Temp (!) 96.9 F (36.1 C)   Resp 16   Ht 5\' 10"  (1.778 m)   Wt 223 lb (101.2 kg)   SpO2 99%   BMI 32.00 kg/m    Physical Exam Vitals signs and nursing note reviewed.  Constitutional:      General: He is not in acute distress.    Appearance: He is well-developed. He is not diaphoretic.  HENT:     Head: Normocephalic and atraumatic.     Mouth/Throat:     Pharynx: No oropharyngeal exudate.  Eyes:     Pupils: Pupils are equal, round, and reactive to light.  Neck:     Musculoskeletal: Normal range of motion and neck supple.     Thyroid: No thyromegaly.     Vascular: No JVD.     Trachea: No tracheal deviation.  Cardiovascular:     Rate and Rhythm: Normal rate and regular rhythm.     Heart sounds: Normal heart sounds. No murmur. No friction rub. No gallop.   Pulmonary:     Effort: Pulmonary effort is normal. No respiratory distress.     Breath sounds: Normal breath sounds. No wheezing or rales.  Chest:     Chest wall: No tenderness.  Abdominal:     Palpations: Abdomen is soft.     Tenderness: There is no abdominal tenderness. There is no  guarding.  Musculoskeletal: Normal range of motion.  Lymphadenopathy:     Cervical: No cervical adenopathy.  Skin:    General: Skin is warm and dry.  Neurological:     Mental Status: He is alert and oriented to person, place, and time.     Cranial Nerves: No cranial nerve deficit.  Psychiatric:        Behavior: Behavior normal.        Thought Content: Thought content normal.        Judgment: Judgment normal.    Assessment/Plan: 1. Abdominal gas pain Discussed with patient to stop taking his multi-vitamin as this may be causing his GI upset and symptoms.  Trial of not taking multi-vitamin for 3-4 weeks and to monitor symptoms. If symptoms persist instructed patient to call back for GI referral.  2. Diarrhea, unspecified type Diarrhea is slowing, stool is soft but not watery. Continue to take in adequate amount of fluid.  Continue to keep food journal.   General Counseling: Oral verbalizes understanding of the findings of todays visit and agrees with plan of treatment. I have discussed any further diagnostic evaluation that may be needed or ordered today. We also reviewed his medications today. he has been encouraged to call the office with any questions or concerns that should arise related to todays visit.    No orders of the defined types were placed in this encounter.   No orders of the defined types were placed in this encounter.   Time spent: 25 Minutes   This patient was seen by Orson Gear AGNP-C in Collaboration with Dr Lavera Guise as a part of collaborative care agreement     Kendell Bane AGNP-C Internal medicine

## 2019-01-06 NOTE — Telephone Encounter (Signed)
Patient states he just had a appointment with PCP and started him on a new medication

## 2019-01-11 ENCOUNTER — Other Ambulatory Visit: Payer: Self-pay

## 2019-01-11 ENCOUNTER — Ambulatory Visit: Payer: BC Managed Care – PPO | Admitting: Gastroenterology

## 2019-01-11 ENCOUNTER — Encounter: Payer: Self-pay | Admitting: Gastroenterology

## 2019-01-11 VITALS — BP 132/86 | HR 80 | Temp 98.2°F | Wt 222.2 lb

## 2019-01-11 DIAGNOSIS — R1013 Epigastric pain: Secondary | ICD-10-CM | POA: Diagnosis not present

## 2019-01-11 NOTE — Patient Instructions (Signed)
High-Fiber Diet Fiber, also called dietary fiber, is a type of carbohydrate that is found in fruits, vegetables, whole grains, and beans. A high-fiber diet can have many health benefits. Your health care provider may recommend a high-fiber diet to help:  Prevent constipation. Fiber can make your bowel movements more regular.  Lower your cholesterol.  Relieve the following conditions: ? Swelling of veins in the anus (hemorrhoids). ? Swelling and irritation (inflammation) of specific areas of the digestive tract (uncomplicated diverticulosis). ? A problem of the large intestine (colon) that sometimes causes pain and diarrhea (irritable bowel syndrome, IBS).  Prevent overeating as part of a weight-loss plan.  Prevent heart disease, type 2 diabetes, and certain cancers. What is my plan? The recommended daily fiber intake in grams (g) includes:  38 g for men age 50 or younger.  30 g for men over age 50.  25 g for women age 50 or younger.  21 g for women over age 50. You can get the recommended daily intake of dietary fiber by:  Eating a variety of fruits, vegetables, grains, and beans.  Taking a fiber supplement, if it is not possible to get enough fiber through your diet. What do I need to know about a high-fiber diet?  It is better to get fiber through food sources rather than from fiber supplements. There is not a lot of research about how effective supplements are.  Always check the fiber content on the nutrition facts label of any prepackaged food. Look for foods that contain 5 g of fiber or more per serving.  Talk with a diet and nutrition specialist (dietitian) if you have questions about specific foods that are recommended or not recommended for your medical condition, especially if those foods are not listed below.  Gradually increase how much fiber you consume. If you increase your intake of dietary fiber too quickly, you may have bloating, cramping, or gas.  Drink plenty  of water. Water helps you to digest fiber. What are tips for following this plan?  Eat a wide variety of high-fiber foods.  Make sure that half of the grains that you eat each day are whole grains.  Eat breads and cereals that are made with whole-grain flour instead of refined flour or white flour.  Eat brown rice, bulgur wheat, or millet instead of white rice.  Start the day with a breakfast that is high in fiber, such as a cereal that contains 5 g of fiber or more per serving.  Use beans in place of meat in soups, salads, and pasta dishes.  Eat high-fiber snacks, such as berries, raw vegetables, nuts, and popcorn.  Choose whole fruits and vegetables instead of processed forms like juice or sauce. What foods can I eat?  Fruits Berries. Pears. Apples. Oranges. Avocado. Prunes and raisins. Dried figs. Vegetables Sweet potatoes. Spinach. Kale. Artichokes. Cabbage. Broccoli. Cauliflower. Green peas. Carrots. Squash. Grains Whole-grain breads. Multigrain cereal. Oats and oatmeal. Brown rice. Barley. Bulgur wheat. Millet. Quinoa. Bran muffins. Popcorn. Rye wafer crackers. Meats and other proteins Navy, kidney, and pinto beans. Soybeans. Split peas. Lentils. Nuts and seeds. Dairy Fiber-fortified yogurt. Beverages Fiber-fortified soy milk. Fiber-fortified orange juice. Other foods Fiber bars. The items listed above may not be a complete list of recommended foods and beverages. Contact a dietitian for more options. What foods are not recommended? Fruits Fruit juice. Cooked, strained fruit. Vegetables Fried potatoes. Canned vegetables. Well-cooked vegetables. Grains White bread. Pasta made with refined flour. White rice. Meats and other   proteins Fatty cuts of meat. Fried chicken or fried fish. Dairy Milk. Yogurt. Cream cheese. Sour cream. Fats and oils Butters. Beverages Soft drinks. Other foods Cakes and pastries. The items listed above may not be a complete list of foods  and beverages to avoid. Contact a dietitian for more information. Summary  Fiber is a type of carbohydrate. It is found in fruits, vegetables, whole grains, and beans.  There are many health benefits of eating a high-fiber diet, such as preventing constipation, lowering blood cholesterol, helping with weight loss, and reducing your risk of heart disease, diabetes, and certain cancers.  Gradually increase your intake of fiber. Increasing too fast can result in cramping, bloating, and gas. Drink plenty of water while you increase your fiber.  The best sources of fiber include whole fruits and vegetables, whole grains, nuts, seeds, and beans. This information is not intended to replace advice given to you by your health care provider. Make sure you discuss any questions you have with your health care provider. Document Released: 01/14/2005 Document Revised: 11/18/2016 Document Reviewed: 11/18/2016 Elsevier Patient Education  [place]. Low-Fiber Eating Plan Fiber is found in fruits, vegetables, whole grains, and beans. Eating a diet low in fiber helps to reduce how often you have bowel movements and how much you produce during a bowel movement. A low-fiber eating plan may help your digestive system heal if:  You have certain conditions, such as Crohn's disease or diverticulitis.  You recently had radiation therapy on your pelvis or bowel.  You recently had intestinal surgery.  You have a new surgical opening in your abdomen (colostomy or ileostomy).  Your intestine is narrowed (stricture). Your health care provider will determine how long you need to stay on this diet. Your health care provider may recommend that you work with a diet and nutrition specialist (dietitian). What are tips for following this plan? General guidelines  Follow recommendations from your dietitian about how much fiber you should have each day.  Most people on this eating plan should try to eat less than 10  grams (g) of fiber each day. Your daily fiber goal is _________________ g.  Take vitamin and mineral supplements as told by your health care provider or dietitian. Chewable or liquid forms are best when on this eating plan. Reading food labels  Check food labels for the amount of dietary fiber.  Choose foods that have less than 2 grams of fiber in one serving. Cooking  Use white flour and other allowed grains for baking and cooking.  Cook meat using methods that keep it tender, such as braising or poaching.  Cook eggs until the yolk is completely solid.  Cook with healthy oils, such as olive oil or canola oil. Meal planning   Eat 5-6 small meals throughout the day instead of 3 large meals.  If you are lactose intolerant: ? Choose low-lactose dairy foods. ? Do not eat dairy foods, if told by your dietitian.  Limit fat and oils to less than 8 teaspoons a day.  Eat small portions of desserts. What foods are allowed? The items listed below may not be a complete list. Talk with your dietitian about what dietary choices are best for you. Grains All bread and crackers made with white flour. Waffles, pancakes, and [place] toast. Bagels. Pretzels. Melba toast, zwieback, and matzoh. Cooked and dried cereals that do not contain whole grains, added fiber, seeds, or dried fruit. CornmealDomenick Gong. Hot and cold cereals made with refined corn, wheat,  rice, or oats. Plain pasta and noodles. White rice. Vegetables Well-cooked or canned vegetables without skin, seeds, or stems. Cooked potatoes without skins. Vegetable juice. Fruits Soft-cooked or canned fruits without skin and seeds. Peeled ripe banana. Applesauce. Fruit juice without pulp. Meats and other protein foods Ground meat. Tender cuts of meat or poultry. Eggs. Fish, seafood, and shellfish. Smooth nut butters. Tofu. Dairy All milk products and drinks. Lactose-free milks, including rice, soy, and almond milks. Yogurt without fruit, nuts,  chocolate, or granola mix-ins. Sour cream. Cottage cheese. Cheese. Beverages Decaf coffee. Fruit and vegetable juices or smoothies (in small amounts, with no pulp or skins, and with fruits from allowed list). Sports drinks. Herbal tea. Fats and oils Olive oil, canola oil, sunflower oil, flaxseed oil, and grapeseed oil. Mayonnaise. Cream cheese. Margarine. Butter. Sweets and desserts Plain cakes and cookies. Cream pies and pies made with allowed fruits. Pudding. Custard. Fruit gelatin. Sherbet. Popsicles. Ice cream without nuts. Plain hard candy. Honey. Jelly. Molasses. Syrups, including chocolate syrup. Chocolate. Marshmallows. Gumdrops. Seasoning and other foods Bouillon. Broth. Cream soups made from allowed foods. Strained soup. Casseroles made with allowed foods. Ketchup. Mild mustard. Mild salad dressings. Plain gravies. Vinegar. Spices in moderation. Salt. Sugar. What foods are not allowed? The items listed below may not be a complete list. Talk with your dietitian about what dietary choices are best for you. Grains Whole wheat and whole grain breads and crackers. Multigrain breads and crackers. Rye bread. Whole grain or multigrain cereals. Cereals with nuts, raisins, or coconut. Bran. Coarse wheat cereals. Granola. High-fiber cereals. Cornmeal or corn bread. Whole grain pasta. Wild or brown rice. Quinoa. Popcorn. Buckwheat. Wheat germ. Vegetables Potato skins. Raw or undercooked vegetables. All beans and bean sprouts. Cooked greens. Corn. Peas. Cabbage. Beets. Broccoli. Brussels sprouts. Cauliflower. Mushrooms. Onions. Peppers. Parsnips. Okra. Sauerkraut. Fruit Raw or dried fruit. Berries. Fruit juice with pulp. Prune juice. Meats and other protein foods Tough, fibrous meats with gristle. Fatty meat. Poultry with skin. Fried meat, Sales executive, or fish. Deli or lunch meats. Sausage, bacon, and hot dogs. Nuts and chunky nut butter. Dried peas, beans, and lentils. Dairy Yogurt with fruit, nuts,  chocolate, or granola mix-ins. Beverages Caffeinated coffee and teas. Fats and oils Avocado. Coconut. Sweets and desserts Desserts, cookies, or candies that contain nuts or coconut. Dried fruit. Jams and preserves with seeds. Marmalade. Any dessert made with fruits or grains that are not allowed. Seasoning and other foods Corn tortilla chips. Soups made with vegetables or grains that are not allowed. Relish. Horseradish. Angie Fava. Olives. Summary  Most people on a low-fiber eating plan should eat less than 10 grams of fiber a day. Follow recommendations from your dietitian about how much fiber you should have each day.  Always check food labels to see the dietary fiber content of packaged foods. In general, a low-fiber food will have fewer than 2 grams of fiber per serving.  In general, try to avoid whole grains, raw fruits and vegetables, dried fruit, tough cuts of meat, nuts, and seeds.  Take a vitamin and mineral supplement as told by your health care provider or dietitian. This information is not intended to replace advice given to you by your health care provider. Make sure you discuss any questions you have with your health care provider. Document Released: 07/06/2001 Document Revised: 05/08/2018 Document Reviewed: 03/19/2016 Elsevier Patient Education  2020 [employer].

## 2019-01-11 NOTE — Progress Notes (Signed)
Ricky Ross [address]  [place]  [place], [place] [address]  Main: [phone]  Fax: [phone]   Gastroenterology Consultation  Referring Provider:     Lavera Guise, MD Primary Care Physician:  Lavera Guise, MD Reason for Consultation:     Abdominal bloating        HPI:    Chief Complaint  Patient presents with  . Gas    Patient states he has gas and diarrhea     Ricky Ross is a 48 y.o. y/o male referred for consultation & management  by Dr. Humphrey Rolls, Timoteo Gaul, MD.  Patient reports 1 month history of intermittent abdominal bloating.  States tried to make some diet changes but this did not help.  No nausea or vomiting or weight loss due to this.  Denies any abdominal pain.  Also reports increased flatulence.  No blood in stool.  Reports bowel movements are baseline which consist of 2-3 bowel movements a day needed with formed.  Denies constipation.  Does report intermittent loose stools.  That is his baseline and has not changed.  No family history of colon cancer.  Underwent screening colonoscopyWith a 4 mm ascending colon polyp removed that showed tubular adenoma.  Otherwise normal.  Past Medical History:  Diagnosis Date  . Heart murmur   . Mitral valve disorder     Past Surgical History:  Procedure Laterality Date  . CARDIAC CATHETERIZATION N/A 03/22/2015   Procedure: Right/Left Heart Cath and Coronary Angiography;  Surgeon: Corey Skains, MD;  Location: [facility];  Service: Cardiovascular;  Laterality: N/A;  . COLONOSCOPY WITH PROPOFOL N/A 11/27/2018   Procedure: COLONOSCOPY WITH PROPOFOL;  Surgeon: Virgel Manifold, MD;  Location: [facility];  Service: Endoscopy;  Laterality: N/A;  . MITRAL VALVE REPAIR    . TEE WITHOUT CARDIOVERSION N/A 03/22/2015   Procedure: TRANSESOPHAGEAL ECHOCARDIOGRAM (TEE);  Surgeon: Corey Skains, MD;  Location: [facility];  Service: Cardiovascular;  Laterality: N/A;    Prior to Admission medications    Medication Sig Start Date End Date Taking? Authorizing Provider  Multiple Vitamin (MULTI-VITAMINS) TABS Take by mouth.   Yes [provider]  triamcinolone (KENALOG) 0.025 % cream Apply 1 application topically 2 (two) times daily. 06/18/18  Yes Ronnell Freshwater, NP    Family History  Family history unknown: Yes     Social History   Tobacco Use  . Smoking status: Never Smoker  . Smokeless tobacco: Never Used  Substance Use Topics  . Alcohol use: No  . Drug use: No    Allergies as of 01/11/2019 - Review Complete 01/11/2019  Allergen Reaction Noted  . 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 Milk-related compounds Hives 03/22/2015    Review of Systems:    All systems reviewed and negative except where noted in HPI.   Physical Exam:  BP 132/86 (BP Location: Left Arm, Patient Position: Sitting, Cuff Size: Normal)   Pulse 80   Temp 98.2 F (36.8 C) (Oral)   Wt 222 lb 4 oz (100.8 kg)   BMI 31.89 kg/m  No LMP for male patient. Psych:  Alert and cooperative. Normal mood and affect. General:   Alert,  Well-developed, well-nourished, pleasant and cooperative in NAD Head:  Normocephalic and atraumatic. Eyes:  Sclera clear, no icterus.   Conjunctiva pink. Ears:  Normal auditory acuity. Nose:  No deformity, discharge, or lesions. Mouth:  No deformity or lesions,oropharynx pink & moist. Neck:  Supple; no masses or  thyromegaly. Abdomen:  Normal bowel sounds.  No bruits.  Soft, non-tender and non-distended without masses, hepatosplenomegaly or hernias noted.  No guarding or rebound tenderness.    Msk:  Symmetrical without gross deformities. Good, equal movement & strength bilaterally. Pulses:  Normal pulses noted. Extremities:  No clubbing or edema.  No cyanosis. Neurologic:  Alert and oriented x3;  grossly normal neurologically. Skin:  Intact without significant lesions or rashes. No jaundice. Lymph Nodes:  No significant cervical adenopathy. Psych:  Alert and cooperative. Normal  mood and affect.   Labs: CBC    Component Value Date/Time   WBC 4.5 01/12/2018 0807   WBC 6.9 08/27/2015 1656   RBC 5.47 01/12/2018 0807   RBC 4.73 08/27/2015 1656   HGB 16.2 01/12/2018 0807   HCT 48.8 01/12/2018 0807   PLT 183 01/12/2018 0807   MCV 89 01/12/2018 0807   MCH 29.6 01/12/2018 0807   MCH 28.6 08/27/2015 1656   MCHC 33.2 01/12/2018 0807   MCHC 33.4 08/27/2015 1656   RDW 13.7 01/12/2018 0807   LYMPHSABS 1.8 01/12/2018 0807   EOSABS 0.0 01/12/2018 0807   BASOSABS 0.0 01/12/2018 0807   CMP     Component Value Date/Time   NA 139 03/11/2018 0805   K 4.7 03/11/2018 0805   CL 101 03/11/2018 0805   CO2 20 03/11/2018 0805   GLUCOSE 91 03/11/2018 0805   GLUCOSE 102 (H) 08/27/2015 1656   BUN 14 03/11/2018 0805   CREATININE 1.45 (H) 03/11/2018 0805   CALCIUM 9.1 03/11/2018 0805   PROT 6.7 01/12/2018 0807   ALBUMIN 4.0 01/12/2018 0807   AST 20 01/12/2018 0807   ALT 17 01/12/2018 0807   ALKPHOS 46 01/12/2018 0807   BILITOT 0.4 01/12/2018 0807   GFRNONAA 56 (L) 03/11/2018 0805   GFRAA 65 03/11/2018 0805    Imaging Studies: No results found.  Assessment and Plan:   Ricky Ross is a 48 y.o. y/o male has been referred for abdominal bloating  We will check for H. pylori  If positive, will explain his symptoms  Avoiding high FODMAP foods discussed as well and handout given for the same  If H. pylori is negative, trial of H2 RA or PPI would be next step  No alarm symptoms present to indicate upper endoscopy at this time    Dr Ricky Ross  Speech recognition software was used to dictate the above note.

## 2019-01-12 LAB — H. PYLORI BREATH TEST: H pylori Breath Test: NEGATIVE

## 2019-02-01 ENCOUNTER — Telehealth: Payer: Self-pay

## 2019-02-01 MED ORDER — FAMOTIDINE 20 MG PO TABS: 20.0000 mg | ORAL_TABLET | Freq: Every day | ORAL | 0 refills | Status: DC

## 2019-02-01 NOTE — Telephone Encounter (Signed)
Tried to call patient but voicemail is full. Sent patient a Therapist, music and sent medication

## 2019-02-01 NOTE — Telephone Encounter (Signed)
-----   Message from Virgel Manifold, MD sent at 02/01/2019  1:22 PM EST ----- Caryl Pina please let the patient know, his H. pylori test was negative.  I recommend that he start Pepcid 20 mg once daily.  Please prescribe for 30 days, no refills.  In the meantime, continue to avoid high FODMAP foods

## 2019-02-10 ENCOUNTER — Other Ambulatory Visit: Payer: Self-pay | Admitting: Adult Health

## 2019-02-10 DIAGNOSIS — N281 Cyst of kidney, acquired: Secondary | ICD-10-CM

## 2019-02-15 ENCOUNTER — Ambulatory Visit: Payer: BC Managed Care – PPO | Admitting: Adult Health

## 2019-02-17 ENCOUNTER — Other Ambulatory Visit: Payer: Self-pay

## 2019-02-17 ENCOUNTER — Ambulatory Visit
Admission: RE | Admit: 2019-02-17 | Discharge: 2019-02-17 | Disposition: A | Discharge status: Home / Self Care | Payer: BC Managed Care – PPO | Source: Ambulatory Visit | Attending: Adult Health | Admitting: Adult Health

## 2019-02-17 DIAGNOSIS — N281 Cyst of kidney, acquired: Secondary | ICD-10-CM

## 2019-02-17 MED ORDER — IOHEXOL 300 MG/ML  SOLN
100.0000 mL | Freq: Once | INTRAMUSCULAR | Status: AC | PRN
  Administered 2019-02-17: 16:00:00 100 mL via INTRAVENOUS

## 2019-02-22 ENCOUNTER — Telehealth: Payer: Self-pay

## 2019-02-22 NOTE — Telephone Encounter (Signed)
Patient cancelled appointment on 02/24/2019 will call back to reschedule. klh

## 2019-02-24 ENCOUNTER — Ambulatory Visit: Payer: BC Managed Care – PPO | Admitting: Adult Health

## 2019-04-08 ENCOUNTER — Ambulatory Visit: Payer: BC Managed Care – PPO

## 2019-05-11 ENCOUNTER — Encounter: Payer: Self-pay | Admitting: Gastroenterology

## 2019-05-12 ENCOUNTER — Encounter: Payer: Self-pay | Admitting: Gastroenterology

## 2019-05-12 ENCOUNTER — Ambulatory Visit (INDEPENDENT_AMBULATORY_CARE_PROVIDER_SITE_OTHER): Payer: BC Managed Care – PPO | Admitting: Gastroenterology

## 2019-05-12 DIAGNOSIS — R140 Abdominal distension (gaseous): Secondary | ICD-10-CM

## 2019-05-12 DIAGNOSIS — R1013 Epigastric pain: Secondary | ICD-10-CM

## 2019-05-12 MED ORDER — OMEPRAZOLE 20 MG PO CPDR: 20.0000 mg | DELAYED_RELEASE_CAPSULE | Freq: Every day | ORAL | 0 refills | Status: DC

## 2019-05-12 NOTE — Progress Notes (Signed)
Vonda Antigua, MD [address]  [place]  [place], [place] [address]  Main: [phone]  Fax: [phone]   Primary Care Physician: Lavera Guise, MD  Virtual Visit via Video Note  I connected with patient on 05/12/19 at 11:45 AM EDT by video (using [email or web address]) and verified that I am speaking with the correct person using two identifiers.   I discussed the limitations, risks, security and privacy concerns of performing an evaluation and management service by video and the availability of in person appointments. I also discussed with the patient that there may be a patient responsible charge related to this service. The patient expressed understanding and agreed to proceed.  Location of Patient: Home Location of Provider: Home Persons involved: Patient and provider only (Nursing staff checked in patient via phone but were not physically involved in the video interaction - see their notes)   History of Present Illness: Chief Complaint  Patient presents with  . Diarrhea    patient states 2 times a week. patient states it is loose stools   . Gas    Patient states he has alot of gass all the time   . Gastroesophageal Reflux    States pepcid made his symptoms worse so he only took it for 2 days     HPI: Ricky Ross is a 49 y.o. male previously seen for abdominal bloating presents for follow-up.  H. pylori breath test was negative.  Pepcid was prescribed but patient only took it for 3 days and states symptoms got worse with it but never notified us during that time.  Continues to have abdominal bloating.  Reports 2-3 soft to formed bowel movements daily without blood.  No abdominal pain.  No nausea or vomiting.  Has not started FODMAP diet.  No family history of colon cancer.  Colonoscopy up-to-date.  Current Outpatient Medications  Medication Sig Dispense Refill  . Multiple Vitamin (MULTI-VITAMINS) TABS Take by mouth.    . triamcinolone (KENALOG) 0.025 % cream Apply 1  application topically 2 (two) times daily. 80 g 2  . omeprazole (PRILOSEC) 20 MG capsule Take 1 capsule (20 mg total) by mouth daily. 30 capsule 0   No current facility-administered medications for this visit.    Allergies as of 05/12/2019 - Review Complete 05/12/2019  Allergen Reaction Noted  . 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 Milk-related compounds Hives 03/22/2015    Review of Systems:    All systems reviewed and negative except where noted in HPI.   Observations/Objective:  Labs: CMP     Component Value Date/Time   NA 139 03/11/2018 0805   K 4.7 03/11/2018 0805   CL 101 03/11/2018 0805   CO2 20 03/11/2018 0805   GLUCOSE 91 03/11/2018 0805   GLUCOSE 102 (H) 08/27/2015 1656   BUN 14 03/11/2018 0805   CREATININE 1.45 (H) 03/11/2018 0805   CALCIUM 9.1 03/11/2018 0805   PROT 6.7 01/12/2018 0807   ALBUMIN 4.0 01/12/2018 0807   AST 20 01/12/2018 0807   ALT 17 01/12/2018 0807   ALKPHOS 46 01/12/2018 0807   BILITOT 0.4 01/12/2018 0807   GFRNONAA 56 (L) 03/11/2018 0805   GFRAA 65 03/11/2018 0805   Lab Results  Component Value Date   WBC 4.5 01/12/2018   HGB 16.2 01/12/2018   HCT 48.8 01/12/2018   MCV 89 01/12/2018   PLT 183 01/12/2018    Imaging Studies: No results found.  Assessment and Plan:   Ricky Ross is a 49  y.o. y/o male here for follow-up of abdominal bloating  Assessment and Plan: Trial of low-dose omeprazole for dyspepsia as Pepcid did not work  Prescribed only for 3 weeks and if symptoms not better in 2 weeks patient was advised to stop this completely  (Risks of PPI use were discussed with patient including bone loss, C. Diff diarrhea, pneumonia, infections, CKD, electrolyte abnormalities.  Pt. Verbalizes understanding and chooses to continue the medication.)  FODMAP diet discussed again  Follow Up Instructions:    I discussed the assessment and treatment plan with the patient. The patient was provided an opportunity to ask questions and all  were answered. The patient agreed with the plan and demonstrated an understanding of the instructions.   The patient was advised to call back or seek an in-person evaluation if the symptoms worsen or if the condition fails to improve as anticipated.  I provided 15 minutes of face-to-face time via video software during this encounter. Additional time was spent in reviewing patient's chart, placing orders etc.   Virgel Manifold, MD  Speech recognition software was used to dictate this note.

## 2019-05-14 ENCOUNTER — Telehealth: Payer: Self-pay | Admitting: Gastroenterology

## 2019-05-14 NOTE — Telephone Encounter (Signed)
I called & spoke with patient to schedule an appointmentAggie Moats, Eddie North, CMA  [facility]  Patient needs 6-8 week follow up with Dr. Bonna Gains   but he was unable to make at this time & will call back.

## 2019-07-07 ENCOUNTER — Ambulatory Visit: Payer: BC Managed Care – PPO | Admitting: Gastroenterology

## 2020-05-08 ENCOUNTER — Other Ambulatory Visit: Payer: Self-pay

## 2020-05-08 ENCOUNTER — Encounter: Payer: Self-pay | Admitting: Physician Assistant

## 2020-05-08 ENCOUNTER — Ambulatory Visit (INDEPENDENT_AMBULATORY_CARE_PROVIDER_SITE_OTHER): Payer: BC Managed Care – PPO | Admitting: Physician Assistant

## 2020-05-08 DIAGNOSIS — R300 Dysuria: Secondary | ICD-10-CM

## 2020-05-08 DIAGNOSIS — Z0001 Encounter for general adult medical examination with abnormal findings: Secondary | ICD-10-CM | POA: Diagnosis not present

## 2020-05-08 DIAGNOSIS — R141 Gas pain: Secondary | ICD-10-CM

## 2020-05-08 DIAGNOSIS — H5319 Other subjective visual disturbances: Secondary | ICD-10-CM

## 2020-05-08 DIAGNOSIS — R030 Elevated blood-pressure reading, without diagnosis of hypertension: Secondary | ICD-10-CM | POA: Diagnosis not present

## 2020-05-08 DIAGNOSIS — R5383 Other fatigue: Secondary | ICD-10-CM

## 2020-05-08 NOTE — Progress Notes (Signed)
[facility] [place], [place] [address]  Internal MEDICINE  Office Visit Note  Patient Name: Ricky Ross  [id number]  [id number]  Date of Service: 05/09/2020  Chief Complaint  Patient presents with  . Annual Exam  . Heart Murmur  . 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 -Eye doctor gave new glasses, thought it would help with cross eyed sensation. It happens on and off. If he closes one eye it fixes it. Per pt the eye doctor did a scan that was normal. Last saw them 1 year ago. Still has the cross eyed sensation at times. Advised to return to eye doctor for further evaluation since it has been a year.  -Gas for a long time, stopped milk and does lactaid and mainly has gas at night. Was seen by GI and they found nothing to explain gas. He did have a colonoscopy 10/2018 that showed a polyp. Tried Gasx one time but wasn't good about taking it. Gas isnt getting worse and not painful. Regular BM, no diarrhea or constipation. Stools are softer. -Gi recommended PPT--stopped prilosec didn't think it helped; GI recommended FODMAP diet however patient did not really incorporate this. Encouraged to do so now.  Current Medication: Outpatient Encounter Medications as of 05/08/2020  Medication Sig  . Multiple Vitamin (MULTI-VITAMINS) TABS Take by mouth.  Marland Kitchen omeprazole (PRILOSEC) 20 MG capsule Take 1 capsule (20 mg total) by mouth daily. (Patient not taking: Reported on 05/08/2020)  . [DISCONTINUED] triamcinolone (KENALOG) 0.025 % cream Apply 1 application topically 2 (two) times daily. (Patient not taking: Reported on 05/08/2020)   No facility-administered encounter medications on file as of 05/08/2020.    Surgical History: Past Surgical History:  Procedure Laterality Date  . CARDIAC CATHETERIZATION N/A 03/22/2015   Procedure: Right/Left Heart Cath and Coronary Angiography;  Surgeon:  Corey Skains, MD;  Location: [facility];  Service: Cardiovascular;  Laterality: N/A;  . COLONOSCOPY WITH PROPOFOL N/A 11/27/2018   Procedure: COLONOSCOPY WITH PROPOFOL;  Surgeon: Virgel Manifold, MD;  Location: [facility];  Service: Endoscopy;  Laterality: N/A;  . MITRAL VALVE REPAIR    . TEE WITHOUT CARDIOVERSION N/A 03/22/2015   Procedure: TRANSESOPHAGEAL ECHOCARDIOGRAM (TEE);  Surgeon: Corey Skains, MD;  Location: [facility];  Service: Cardiovascular;  Laterality: N/A;    Medical History: Past Medical History:  Diagnosis Date  . Heart murmur   . Mitral valve disorder     Family History: Family History  Family history unknown: Yes      Review of Systems  Constitutional: Negative for chills, fatigue and unexpected weight change.  HENT: Negative for congestion, postnasal drip, rhinorrhea, sneezing and sore throat.   Eyes: Positive for visual disturbance. Negative for redness.       Has periods of time where he will have cross eyed sensation, but then resolves--eye dr said everything was normal 1 yr ago and gave new glasses  Respiratory: Negative for cough, chest tightness and shortness of breath.   Cardiovascular: Negative for chest pain and palpitations.  Gastrointestinal: Negative for constipation, diarrhea, nausea and vomiting.       Gas discomfort for years  Genitourinary: Negative for dysuria and frequency.  Musculoskeletal: Negative for arthralgias, back pain, joint swelling and neck pain.  Skin: Negative for rash.  Neurological: Negative.  Negative for tremors and numbness.  Hematological: Negative for adenopathy. Does  not bruise/bleed easily.  Psychiatric/Behavioral: Negative for behavioral problems (Depression), sleep disturbance and suicidal ideas. The patient is not nervous/anxious.      Vital Signs: BP (!) 142/80   Pulse 67   Temp 97.8 F (36.6 C)   Resp 16   Ht 5' 9.5" (1.765 m)   Wt 224 lb (101.6 kg)   SpO2 95%   BMI 32.60 kg/m     Physical Exam Vitals and nursing note reviewed.  Constitutional:      General: He is not in acute distress.    Appearance: He is well-developed. He is obese. He is not diaphoretic.  HENT:     Head: Normocephalic and atraumatic.     Right Ear: External ear normal.     Left Ear: External ear normal.     Nose: Nose normal.     Mouth/Throat:     Pharynx: No oropharyngeal exudate.  Eyes:     General: No scleral icterus.       Right eye: No discharge.        Left eye: No discharge.     Extraocular Movements: Extraocular movements intact.     Conjunctiva/sclera: Conjunctivae normal.     Pupils: Pupils are equal, round, and reactive to light.  Neck:     Thyroid: No thyromegaly.     Vascular: No JVD.     Trachea: No tracheal deviation.  Cardiovascular:     Rate and Rhythm: Normal rate and regular rhythm.     Heart sounds: Normal heart sounds. No murmur heard. No friction rub. No gallop.   Pulmonary:     Effort: Pulmonary effort is normal. No respiratory distress.     Breath sounds: Normal breath sounds. No stridor. No wheezing or rales.  Chest:     Chest wall: No tenderness.  Abdominal:     General: Bowel sounds are normal. There is no distension.     Palpations: Abdomen is soft. There is no mass.     Tenderness: There is no abdominal tenderness. There is no guarding or rebound.  Musculoskeletal:        General: No tenderness or deformity. Normal range of motion.     Cervical back: Normal range of motion and neck supple.  Lymphadenopathy:     Cervical: No cervical adenopathy.  Skin:    General: Skin is warm and dry.     Coloration: Skin is not pale.     Findings: No erythema or rash.  Neurological:     Mental Status: He is alert.     Cranial Nerves: No cranial nerve deficit.     Motor: No abnormal muscle tone.     Coordination: Coordination normal.     Deep Tendon Reflexes: Reflexes are normal and symmetric.  Psychiatric:        Behavior: Behavior normal.         Thought Content: Thought content normal.        Judgment: Judgment normal.      LABS: Recent Results (from the past 2160 hour(s))  UA/M w/rflx Culture, Routine     Status: None   Collection Time: 05/08/20  9:05 AM   Specimen: Urine   Urine  Result Value Ref Range   Specific Gravity, UA 1.024 1.005 - 1.030   pH, UA 6.0 5.0 - 7.5   Color, UA Yellow Yellow   Appearance Ur Clear Clear   Leukocytes,UA Negative Negative   Protein,UA Negative Negative/Trace   Glucose, UA Negative Negative   Ketones, UA  Negative Negative   RBC, UA Negative Negative   Bilirubin, UA Negative Negative   Urobilinogen, Ur 0.2 0.2 - 1.0 mg/dL   Nitrite, UA Negative Negative   Microscopic Examination Comment     Comment: Microscopic follows if indicated.   Microscopic Examination See below:     Comment: Microscopic was indicated and was performed.   Urinalysis Reflex Comment     Comment: This specimen will not reflex to a Urine Culture.  Microscopic Examination     Status: None   Collection Time: 05/08/20  9:05 AM   Urine  Result Value Ref Range   WBC, UA None seen 0 - 5 /hpf   RBC 0-2 0 - 2 /hpf   Epithelial Cells (non renal) None seen 0 - 10 /hpf   Casts None seen None seen /lpf   Bacteria, UA None seen None seen/Few        Assessment/Plan: 1. Encounter for general adult medical examination with abnormal findings UTD on colonoscopy, due for routine fasting labs  2. Abdominal gas pain Will order upper GI for further evaluation. Previously evaluated by GI may need to follow up with them. Recommend trying FODMAP diet now. - DG UGI W SINGLE CM (SOL OR THIN BA)  3. Elevated BP without diagnosis of hypertension Pt will obtain BP cuff and monitor at home.  4. Visual distortion Off and on cross eyed feeling for over a year, but normal exam in office and eye dr reported normal exam 1 yr ago including doing a scan per pt. Pt was started on new glasses at the time. Advised to f/u with Eye dr since  it has been a yr.   5. Other fatigue - CBC With Differential - Comprehensive metabolic panel - Lipid Panel With LDL/HDL Ratio - TSH + free T4 - PSA  6. Dysuria - UA/M w/rflx Culture, Routine   General Counseling: Keagan verbalizes understanding of the findings of todays visit and agrees with plan of treatment. I have discussed any further diagnostic evaluation that may be needed or ordered today. We also reviewed his medications today. he has been encouraged to call the office with any questions or concerns that should arise related to todays visit.    Counseling:    Orders Placed This Encounter  Procedures  . Microscopic Examination  . DG UGI W SINGLE CM (SOL OR THIN BA)  . CBC With Differential  . Comprehensive metabolic panel  . Lipid Panel With LDL/HDL Ratio  . TSH + free T4  . PSA  . UA/M w/rflx Culture, Routine    No orders of the defined types were placed in this encounter.   This patient was seen by Drema Dallas, PA-C in collaboration with Dr. Clayborn Bigness as a part of collaborative care agreement.  Total time spent:30 Minutes  Time spent includes review of chart, medications, test results, and follow up plan with the patient.     Lavera Guise, MD  Internal Medicine

## 2020-05-09 LAB — UA/M W/RFLX CULTURE, ROUTINE
Bilirubin, UA: NEGATIVE
Glucose, UA: NEGATIVE
Ketones, UA: NEGATIVE
Leukocytes,UA: NEGATIVE
Nitrite, UA: NEGATIVE
Protein,UA: NEGATIVE
RBC, UA: NEGATIVE
Specific Gravity, UA: 1.024 (ref 1.005–1.030)
Urobilinogen, Ur: 0.2 mg/dL (ref 0.2–1.0)
pH, UA: 6 (ref 5.0–7.5)

## 2020-05-09 LAB — MICROSCOPIC EXAMINATION
Bacteria, UA: NONE SEEN
Casts: NONE SEEN /lpf
Epithelial Cells (non renal): NONE SEEN /hpf (ref 0–10)
WBC, UA: NONE SEEN /hpf (ref 0–5)

## 2020-05-09 NOTE — Patient Instructions (Signed)
Low-FODMAP Eating Plan  FODMAP stands for fermentable oligosaccharides, disaccharides, monosaccharides, and polyols. These are sugars that are hard for some people to digest. A low-FODMAP eating plan may help some people who have irritable bowel syndrome (IBS) and certain other bowel (intestinal) diseases to manage their symptoms. This meal plan can be complicated to follow. Work with a diet and nutrition specialist (dietitian) to make a low-FODMAP eating plan that is right for you. A dietitian can help make sure that you get enough nutrition from this diet. What are tips for following this plan? Reading food labels  Check labels for hidden FODMAPs such as: ? High-fructose syrup. ? Honey. ? Agave. ? Natural fruit flavors. ? Onion or garlic powder.  Choose low-FODMAP foods that contain 3-4 grams of fiber per serving.  Check food labels for serving sizes. Eat only one serving at a time to make sure FODMAP levels stay low. Shopping  Shop with a list of foods that are recommended on this diet and make a meal plan. Meal planning  Follow a low-FODMAP eating plan for up to 6 weeks, or as told by your health care provider or dietitian.  To follow the eating plan: 1. Eliminate high-FODMAP foods from your diet completely. Choose only low-FODMAP foods to eat. You will do this for 2-6 weeks. 2. Gradually reintroduce high-FODMAP foods into your diet one at a time. Most people should wait a few days before introducing the next new high-FODMAP food into their meal plan. Your dietitian can recommend how quickly you may reintroduce foods. 3. Keep a daily record of what and how much you eat and drink. Make note of any symptoms that you have after eating. 4. Review your daily record with a dietitian regularly to identify which foods you can eat and which foods you should avoid. General tips  Drink enough fluid each day to keep your urine pale yellow.  Avoid processed foods. These often have added sugar  and may be high in FODMAPs.  Avoid most dairy products, whole grains, and sweeteners.  Work with a dietitian to make sure you get enough fiber in your diet.  Avoid high FODMAP foods at meals to manage symptoms. Recommended foods Fruits Bananas, oranges, tangerines, lemons, limes, blueberries, raspberries, strawberries, grapes, cantaloupe, honeydew melon, kiwi, papaya, passion fruit, and pineapple. Limited amounts of dried cranberries, banana chips, and shredded coconut. Vegetables Eggplant, zucchini, cucumber, peppers, green beans, bean sprouts, lettuce, arugula, kale, Swiss chard, spinach, collard greens, bok choy, summer squash, potato, and tomato. Limited amounts of corn, carrot, and sweet potato. Green parts of scallions. Grains Gluten-free grains, such as rice, oats, buckwheat, quinoa, corn, polenta, and millet. Gluten-free pasta, bread, or cereal. Rice noodles. Corn tortillas. Meats and other proteins Unseasoned beef, pork, poultry, or fish. Eggs. Berniece Salines. Tofu (firm) and tempeh. Limited amounts of nuts and seeds, such as almonds, walnuts, [place] nuts, pecans, peanuts, nut butters, pumpkin seeds, chia seeds, and sunflower seeds. Dairy Lactose-free milk, yogurt, and kefir. Lactose-free cottage cheese and ice cream. Non-dairy milks, such as almond, coconut, hemp, and rice milk. Non-dairy yogurt. Limited amounts of goat cheese, brie, mozzarella, parmesan, swiss, and other hard cheeses. Fats and oils Butter-free spreads. Vegetable oils, such as olive, canola, and sunflower oil. Seasoning and other foods Artificial sweeteners with names that do not end in "ol," such as aspartame, saccharine, and stevia. Maple syrup, white table sugar, raw sugar, brown sugar, and molasses. Mayonnaise, soy sauce, and tamari. Fresh basil, coriander, parsley, rosemary, and thyme. Beverages Water and  mineral water. Sugar-sweetened soft drinks. Small amounts of orange juice or cranberry juice. Black and green tea.  Most dry wines. Coffee. The items listed above may not be a complete list of foods and beverages you can eat. Contact a dietitian for more information. Foods to avoid Fruits Fresh, dried, and juiced forms of apple, pear, watermelon, peach, plum, cherries, apricots, blackberries, boysenberries, figs, nectarines, and mango. Avocado. Vegetables Chicory root, artichoke, asparagus, cabbage, snow peas, Brussels sprouts, broccoli, sugar snap peas, mushrooms, celery, and cauliflower. Onions, garlic, leeks, and the white part of scallions. Grains Wheat, including kamut, durum, and semolina. Barley and bulgur. Couscous. Wheat-based cereals. Wheat noodles, bread, crackers, and pastries. Meats and other proteins Fried or fatty meat. Sausage. Cashews and pistachios. Soybeans, baked beans, black beans, chickpeas, kidney beans, fava beans, navy beans, lentils, black-eyed peas, and split peas. Dairy Milk, yogurt, ice cream, and soft cheese. Cream and sour cream. Milk-based sauces. Custard. Buttermilk. Soy milk. Seasoning and other foods Any sugar-free gum or candy. Foods that contain artificial sweeteners such as sorbitol, mannitol, isomalt, or xylitol. Foods that contain honey, high-fructose corn syrup, or agave. Bouillon, vegetable stock, beef stock, and chicken stock. Garlic and onion powder. Condiments made with onion, such as hummus, chutney, pickles, relish, salad dressing, and salsa. Tomato paste. Beverages Chicory-based drinks. Coffee substitutes. Chamomile tea. Fennel tea. Sweet or fortified wines such as port or sherry. Diet soft drinks made with isomalt, mannitol, maltitol, sorbitol, or xylitol. Apple, pear, and mango juice. Juices with high-fructose corn syrup. The items listed above may not be a complete list of foods and beverages you should avoid. Contact a dietitian for more information. Summary  FODMAP stands for fermentable oligosaccharides, disaccharides, monosaccharides, and polyols. These  are sugars that are hard for some people to digest.  A low-FODMAP eating plan is a short-term diet that helps to ease symptoms of certain bowel diseases.  The eating plan usually lasts up to 6 weeks. After that, high-FODMAP foods are reintroduced gradually and one at a time. This can help you find out which foods may be causing symptoms.  A low-FODMAP eating plan can be complicated. It is best to work with a dietitian who has experience with this type of plan. This information is not intended to replace advice given to you by your health care provider. Make sure you discuss any questions you have with your health care provider. Document Revised: 06/03/2019 Document Reviewed: 06/03/2019 Elsevier Patient Education  [employer].

## 2020-05-16 ENCOUNTER — Other Ambulatory Visit: Payer: Self-pay

## 2020-05-16 ENCOUNTER — Ambulatory Visit
Admission: RE | Admit: 2020-05-16 | Discharge: 2020-05-16 | Disposition: A | Discharge status: Home / Self Care | Payer: BC Managed Care – PPO | Source: Ambulatory Visit | Attending: Physician Assistant | Admitting: Physician Assistant

## 2020-05-16 DIAGNOSIS — R141 Gas pain: Secondary | ICD-10-CM | POA: Insufficient documentation

## 2020-08-01 ENCOUNTER — Telehealth: Payer: Self-pay

## 2020-08-01 NOTE — Telephone Encounter (Signed)
Per patient request, lab orders faxed to [phone]

## 2020-08-02 LAB — COMPREHENSIVE METABOLIC PANEL
ALT: 17 IU/L (ref 0–44)
AST: 24 IU/L (ref 0–40)
Albumin/Globulin Ratio: 1.8 (ref 1.2–2.2)
Albumin: 4.3 g/dL (ref 3.8–4.9)
Alkaline Phosphatase: 50 IU/L (ref 44–121)
BUN/Creatinine Ratio: 12 (ref 9–20)
BUN: 17 mg/dL (ref 6–24)
Bilirubin Total: 0.6 mg/dL (ref 0.0–1.2)
CO2: 22 mmol/L (ref 20–29)
Calcium: 9 mg/dL (ref 8.7–10.2)
Chloride: 104 mmol/L (ref 96–106)
Creatinine, Ser: 1.47 mg/dL — ABNORMAL HIGH (ref 0.76–1.27)
Globulin, Total: 2.4 g/dL (ref 1.5–4.5)
Glucose: 89 mg/dL (ref 65–99)
Potassium: 4.1 mmol/L (ref 3.5–5.2)
Sodium: 137 mmol/L (ref 134–144)
Total Protein: 6.7 g/dL (ref 6.0–8.5)
eGFR: 57 mL/min/{1.73_m2} — ABNORMAL LOW (ref >59)

## 2020-08-02 LAB — CBC WITH DIFFERENTIAL
Basophils Absolute: 0 10*3/uL (ref 0.0–0.2)
Basos: 1 %
EOS (ABSOLUTE): 0 10*3/uL (ref 0.0–0.4)
Eos: 1 %
Hematocrit: 45.8 % (ref 37.5–51.0)
Hemoglobin: 15.2 g/dL (ref 13.0–17.7)
Immature Grans (Abs): 0 10*3/uL (ref 0.0–0.1)
Immature Granulocytes: 1 %
Lymphocytes Absolute: 1.6 10*3/uL (ref 0.7–3.1)
Lymphs: 33 %
MCH: 29.5 pg (ref 26.6–33.0)
MCHC: 33.2 g/dL (ref 31.5–35.7)
MCV: 89 fL (ref 79–97)
Monocytes Absolute: 0.6 10*3/uL (ref 0.1–0.9)
Monocytes: 12 %
Neutrophils Absolute: 2.5 10*3/uL (ref 1.4–7.0)
Neutrophils: 52 %
RBC: 5.15 x10E6/uL (ref 4.14–5.80)
RDW: 13.9 % (ref 11.6–15.4)
WBC: 4.7 10*3/uL (ref 3.4–10.8)

## 2020-08-02 LAB — LIPID PANEL WITH LDL/HDL RATIO
Cholesterol, Total: 192 mg/dL (ref 100–199)
HDL: 55 mg/dL (ref >39)
LDL Chol Calc (NIH): 125 mg/dL — ABNORMAL HIGH (ref 0–99)
LDL/HDL Ratio: 2.3 ratio (ref 0.0–3.6)
Triglycerides: 67 mg/dL (ref 0–149)
VLDL Cholesterol Cal: 12 mg/dL (ref 5–40)

## 2020-08-02 LAB — PSA: Prostate Specific Ag, Serum: 0.8 ng/mL (ref 0.0–4.0)

## 2020-08-02 LAB — TSH+FREE T4
Free T4: 1.25 ng/dL (ref 0.82–1.77)
TSH: 1.44 u[IU]/mL (ref 0.450–4.500)

## 2020-08-07 ENCOUNTER — Other Ambulatory Visit: Payer: Self-pay

## 2020-08-07 ENCOUNTER — Ambulatory Visit (INDEPENDENT_AMBULATORY_CARE_PROVIDER_SITE_OTHER): Payer: BC Managed Care – PPO | Admitting: Physician Assistant

## 2020-08-07 ENCOUNTER — Encounter: Payer: Self-pay | Admitting: Physician Assistant

## 2020-08-07 DIAGNOSIS — E7800 Pure hypercholesterolemia, unspecified: Secondary | ICD-10-CM | POA: Diagnosis not present

## 2020-08-07 DIAGNOSIS — Z23 Encounter for immunization: Secondary | ICD-10-CM | POA: Diagnosis not present

## 2020-08-07 DIAGNOSIS — N281 Cyst of kidney, acquired: Secondary | ICD-10-CM

## 2020-08-07 MED ORDER — ZOSTER VAC RECOMB ADJUVANTED 50 MCG/0.5ML IM SUSR: 0.5000 mL | Freq: Once | INTRAMUSCULAR | 0 refills | Status: AC

## 2020-08-07 NOTE — Progress Notes (Signed)
[facility] [place], [place] [address]  Internal MEDICINE  Office Visit Note  Patient Name: Ricky Ross  [id number]  [id number]  Date of Service: 08/07/2020  Chief Complaint  Patient presents with   Follow-up    labs   Quality Metric Gaps    shingrix    HPI Pt is here for routine follow up to go over labs. -Labs showed elevated LDL, but overall low risk ratio, will work on diet/exercise and consider statin if not improving -Creatinine also remains elevated, however is stable from previous years. Has a known renal Cyst followed on CT and described as small (59mm) probable benign left renal cortical lesion that was stable on repeat CT in jan 2021. Will continue to monitor renal function -Bp at home today 128/80s, improved on recheck in office -Occasionally still has eye crossing and closing one eye fixes this. Went to eye doctor and was told everything is fine. States it is infrequent and corrects quickly, no other associated symptoms. Discussed informing office of any changes/worsening.  Current Medication: Outpatient Encounter Medications as of 08/07/2020  Medication Sig   Multiple Vitamin (MULTI-VITAMINS) TABS Take by mouth.   [DISCONTINUED] Zoster Vaccine Adjuvanted [facility]) injection Inject 0.5 mLs into the muscle once.   Zoster Vaccine Adjuvanted [facility]) injection Inject 0.5 mLs into the muscle once for 1 dose.   [DISCONTINUED] omeprazole (PRILOSEC) 20 MG capsule Take 1 capsule (20 mg total) by mouth daily. (Patient not taking: Reported on 05/08/2020)   No facility-administered encounter medications on file as of 08/07/2020.    Surgical History: Past Surgical History:  Procedure Laterality Date   CARDIAC CATHETERIZATION N/A 03/22/2015   Procedure: Right/Left Heart Cath and Coronary Angiography;  Surgeon: Corey Skains, MD;  Location: [place] [facility];  Service: Cardiovascular;  Laterality: N/A;   COLONOSCOPY WITH PROPOFOL N/A 11/27/2018    Procedure: COLONOSCOPY WITH PROPOFOL;  Surgeon: Virgel Manifold, MD;  Location: [facility] ENDOSCOPY;  Service: Endoscopy;  Laterality: N/A;   MITRAL VALVE REPAIR     TEE WITHOUT CARDIOVERSION N/A 03/22/2015   Procedure: TRANSESOPHAGEAL ECHOCARDIOGRAM (TEE);  Surgeon: Corey Skains, MD;  Location: [facility] ORS;  Service: Cardiovascular;  Laterality: N/A;    Medical History: Past Medical History:  Diagnosis Date   Heart murmur    Mitral valve disorder     Family History: Family History  Family history unknown: Yes    Social History   Socioeconomic History   Marital status: Married    Spouse name: Not on file   Number of children: Not on file   Years of education: Not on file   Highest education level: Not on file  Occupational History   Not on file  Tobacco Use   Smoking status: Never   Smokeless tobacco: Never  Vaping Use   Vaping Use: Never used  Substance and Sexual Activity   Alcohol use: No   Drug use: No   Sexual activity: Not on file  Other Topics Concern   Not on file  Social History Narrative   Not on file   Social Determinants of Health   Financial Resource Strain: Not on file  Food Insecurity: Not on file  Transportation Needs: Not on file  Physical Activity: Not on file  Stress: Not on file  Social Connections: Not on file  Intimate Partner Violence: Not on file      Review of Systems  Constitutional:  Negative for chills, fatigue and unexpected weight change.  HENT:  Negative for congestion, postnasal drip, rhinorrhea, sneezing and sore throat.   Eyes:  Positive for visual disturbance. Negative for pain and redness.  Respiratory:  Negative for cough, chest tightness and shortness of breath.   Cardiovascular:  Negative for chest pain and palpitations.  Gastrointestinal:  Negative for abdominal pain, constipation, diarrhea, nausea and vomiting.  Genitourinary:  Negative for dysuria and frequency.  Musculoskeletal:  Negative for arthralgias, back  pain, joint swelling and neck pain.  Skin:  Negative for rash.  Neurological: Negative.  Negative for tremors and numbness.  Hematological:  Negative for adenopathy. Does not bruise/bleed easily.  Psychiatric/Behavioral:  Negative for behavioral problems (Depression), sleep disturbance and suicidal ideas. The patient is not nervous/anxious.    Vital Signs: BP (!) 142/94   Pulse 69   Temp (!) 97.1 F (36.2 C)   Resp 16   Ht 5' 9.5" (1.765 m)   Wt 221 lb 3.2 oz (100.3 kg)   SpO2 98%   BMI 32.20 kg/m    Physical Exam Vitals and nursing note reviewed.  Constitutional:      General: He is not in acute distress.    Appearance: He is well-developed. He is obese. He is not diaphoretic.  HENT:     Head: Normocephalic and atraumatic.     Mouth/Throat:     Pharynx: No oropharyngeal exudate.  Eyes:     Pupils: Pupils are equal, round, and reactive to light.  Neck:     Thyroid: No thyromegaly.     Vascular: No JVD.     Trachea: No tracheal deviation.  Cardiovascular:     Rate and Rhythm: Normal rate and regular rhythm.     Heart sounds: Normal heart sounds. No murmur heard.   No friction rub. No gallop.  Pulmonary:     Effort: Pulmonary effort is normal. No respiratory distress.     Breath sounds: No wheezing or rales.  Chest:     Chest wall: No tenderness.  Abdominal:     General: Bowel sounds are normal.     Palpations: Abdomen is soft.  Musculoskeletal:        General: Normal range of motion.     Cervical back: Normal range of motion and neck supple.  Lymphadenopathy:     Cervical: No cervical adenopathy.  Skin:    General: Skin is warm and dry.  Neurological:     Mental Status: He is alert and oriented to person, place, and time.     Cranial Nerves: No cranial nerve deficit.  Psychiatric:        Behavior: Behavior normal.        Thought Content: Thought content normal.        Judgment: Judgment normal.       Assessment/Plan: 1. Hypercholesteremia Educated on  diet and exercise and will continue to monitor. Consider statin if not improving  2. Acquired renal cyst of left kidney Last visualized on imaging in 2021 and found to be stable and believed benign. Elevated Creatine is stable from previous years. Continue to monitor  3. Need for shingles vaccine - Zoster Vaccine Adjuvanted [facility]) injection; Inject 0.5 mLs into the muscle once for 1 dose.  Dispense: 0.5 mL; Refill: 0    General Counseling: Daking verbalizes understanding of the findings of todays visit and agrees with plan of treatment. I have discussed any further diagnostic evaluation that may be needed or ordered today. We also reviewed his medications today. he has been encouraged to call the office  with any questions or concerns that should arise related to todays visit.    No orders of the defined types were placed in this encounter.   Meds ordered this encounter  Medications   Zoster Vaccine Adjuvanted [facility]) injection    Sig: Inject 0.5 mLs into the muscle once for 1 dose.    Dispense:  0.5 mL    Refill:  0     This patient was seen by Drema Dallas, PA-C in collaboration with Dr. Clayborn Bigness as a part of collaborative care agreement.   Total time spent:30 Minutes Time spent includes review of chart, medications, test results, and follow up plan with the patient.      Dr Lavera Guise Internal medicine

## 2021-02-05 ENCOUNTER — Ambulatory Visit: Payer: BC Managed Care – PPO | Admitting: Physician Assistant

## 2021-02-05 ENCOUNTER — Telehealth: Payer: Self-pay

## 2021-02-05 ENCOUNTER — Other Ambulatory Visit: Payer: Self-pay

## 2021-02-05 ENCOUNTER — Encounter: Payer: Self-pay | Admitting: Physician Assistant

## 2021-02-05 DIAGNOSIS — E538 Deficiency of other specified B group vitamins: Secondary | ICD-10-CM | POA: Diagnosis not present

## 2021-02-05 DIAGNOSIS — N281 Cyst of kidney, acquired: Secondary | ICD-10-CM

## 2021-02-05 DIAGNOSIS — R5383 Other fatigue: Secondary | ICD-10-CM

## 2021-02-05 DIAGNOSIS — E7800 Pure hypercholesterolemia, unspecified: Secondary | ICD-10-CM

## 2021-02-05 DIAGNOSIS — H5319 Other subjective visual disturbances: Secondary | ICD-10-CM

## 2021-02-05 NOTE — Progress Notes (Signed)
[facility] [place], [place] [address]  Internal MEDICINE  Office Visit Note  Patient Name: Ricky Ross  [id number]  [id number]  Date of Service: 02/06/2021  Chief Complaint  Patient presents with   Follow-up   Decreased Visual Acuity    Feels like sometimes he goes cross-eyed    HPI Pt is here for routine follow up -still gets cross eyed at times, sometimes happens when driving. Corrects with closing one eye. Goes away on its own after 10-20 seconds. Eye doctor did evaluation and did not find anything. He also received updated glasses and it is still occurring. -Denies any loss of sight in either eye, no double vision. Also denies any eye or head pain -He thinks this first occurred more than a year ago.  -No concussions or injuries prior to this that he is aware of -Discussed considering a neurology referral since ophthalmology did not find anything on their exam. Pt is interested in this. -Working on diet and exercise, but would like to recheck labs. Will go ahead and recheck lipids while fasting as well as other routine labs prior to CPE next visit and to monitor kidney function  Current Medication: Outpatient Encounter Medications as of 02/05/2021  Medication Sig   Multiple Vitamin (MULTI-VITAMINS) TABS Take by mouth.   No facility-administered encounter medications on file as of 02/05/2021.    Surgical History: Past Surgical History:  Procedure Laterality Date   CARDIAC CATHETERIZATION N/A 03/22/2015   Procedure: Right/Left Heart Cath and Coronary Angiography;  Surgeon: Corey Skains, MD;  Location: [place] [facility];  Service: Cardiovascular;  Laterality: N/A;   COLONOSCOPY WITH PROPOFOL N/A 11/27/2018   Procedure: COLONOSCOPY WITH PROPOFOL;  Surgeon: Virgel Manifold, MD;  Location: [facility] ENDOS[facility];  Service: Endoscopy;  Laterality: N/A;   MITRAL VALVE REPAIR     TEE WITHOUT CARDIOVERSION N/A 03/22/2015   Procedure: TRANSESOPHAGEAL  ECHOCARDIOGRAM (TEE);  Surgeon: Corey Skains, MD;  Location: [facility] OR[facility];  Service: Cardiovascular;  Laterality: N/A;    Medical History: Past Medical History:  Diagnosis Date   Heart murmur    Mitral valve disorder     Family History: Family History  Family history unknown: Yes    Social History   Socioeconomic History   Marital status: Married    Spouse name: Not on file   Number of children: Not on file   Years of education: Not on file   Highest education level: Not on file  Occupational History   Not on file  Tobacco Use   Smoking status: Never   Smokeless tobacco: Never  Vaping Use   Vaping Use: Never used  Substance and Sexual Activity   Alcohol use: No   Drug use: No   Sexual activity: Not on file  Other Topics Concern   Not on file  Social History Narrative   Not on file   Social Determinants of Health   Financial Resource Strain: Not on file  Food Insecurity: Not on file  Transportation Needs: Not on file  Physical Activity: Not on file  Stress: Not on file  Social Connections: Not on file  Intimate Partner Violence: Not on file      Review of Systems  Constitutional:  Negative for chills, fatigue and unexpected weight change.  HENT:  Negative for congestion, postnasal drip, rhinorrhea, sneezing and sore throat.   Eyes:  Positive for visual disturbance. Negative for pain and redness.  Respiratory:  Negative for cough, chest tightness and  shortness of breath.   Cardiovascular:  Negative for chest pain and palpitations.  Gastrointestinal:  Negative for abdominal pain, constipation, diarrhea, nausea and vomiting.  Genitourinary:  Negative for dysuria and frequency.  Musculoskeletal:  Negative for arthralgias, back pain, joint swelling and neck pain.  Skin:  Negative for rash.  Neurological: Negative.  Negative for tremors and numbness.  Hematological:  Negative for adenopathy. Does not bruise/bleed easily.  Psychiatric/Behavioral:  Negative for  behavioral problems (Depression), sleep disturbance and suicidal ideas. The patient is not nervous/anxious.    Vital Signs: BP 120/77    Pulse 74    Temp 98.2 F (36.8 C)    Resp 16    Ht 5' 9.5" (1.765 m)    Wt 218 lb 12.8 oz (99.2 kg)    SpO2 99%    BMI 31.85 kg/m    Physical Exam Vitals and nursing note reviewed.  Constitutional:      General: He is not in acute distress.    Appearance: He is well-developed. He is obese. He is not diaphoretic.  HENT:     Head: Normocephalic and atraumatic.     Mouth/Throat:     Pharynx: No oropharyngeal exudate.  Eyes:     Extraocular Movements: Extraocular movements intact.     Pupils: Pupils are equal, round, and reactive to light.  Neck:     Thyroid: No thyromegaly.     Vascular: No JVD.     Trachea: No tracheal deviation.  Cardiovascular:     Rate and Rhythm: Normal rate and regular rhythm.     Heart sounds: Normal heart sounds. No murmur heard.   No friction rub. No gallop.  Pulmonary:     Effort: Pulmonary effort is normal. No respiratory distress.     Breath sounds: No wheezing or rales.  Chest:     Chest wall: No tenderness.  Abdominal:     General: Bowel sounds are normal.     Palpations: Abdomen is soft.  Musculoskeletal:        General: Normal range of motion.     Cervical back: Normal range of motion and neck supple.  Lymphadenopathy:     Cervical: No cervical adenopathy.  Skin:    General: Skin is warm and dry.  Neurological:     Mental Status: He is alert and oriented to person, place, and time.     Cranial Nerves: No cranial nerve deficit.  Psychiatric:        Behavior: Behavior normal.        Thought Content: Thought content normal.        Judgment: Judgment normal.       Assessment/Plan: 1. Visual distortion Will refer to neurology for further evaluation, advised to call or go to ED if any acute worsening or changes - Ambulatory referral to Neurology - TSH + free T4 - B12 and Folate Panel  2. Acquired  renal cyst of left kidney - Comprehensive metabolic panel  3. Hypercholesteremia Working on diet and exercise, will recheck labs. May need statin if not improving - Lipid Panel With LDL/HDL Ratio  4. B12 deficiency - B12 and Folate Panel  5. Other fatigue - Lipid Panel With LDL/HDL Ratio - CBC w/Diff/Platelet - TSH + free T4   General Counseling: Ricky Ross verbalizes understanding of the findings of todays visit and agrees with plan of treatment. I have discussed any further diagnostic evaluation that may be needed or ordered today. We also reviewed his medications today. he has been encouraged  to call the office with any questions or concerns that should arise related to todays visit.    Orders Placed This Encounter  Procedures   Lipid Panel With LDL/HDL Ratio   Comprehensive metabolic panel   CBC w/Diff/Platelet   TSH + free T4   B12 and Folate Panel   Ambulatory referral to Neurology    No orders of the defined types were placed in this encounter.   This patient was seen by Drema Dallas, PA-C in collaboration with Dr. Clayborn Bigness as a part of collaborative care agreement.   Total time spent:30 Minutes Time spent includes review of chart, medications, test results, and follow up plan with the patient.      Dr Lavera Guise Internal medicine

## 2021-02-05 NOTE — Telephone Encounter (Signed)
Awaiting 02/05/21 office notes for Neurology referral-Toni

## 2021-02-08 NOTE — Telephone Encounter (Signed)
Referral sent via [facility] to [facility]

## 2021-02-20 NOTE — Telephone Encounter (Signed)
Neurology appointment> 03/06/21 @ 10:30-Toni

## 2021-04-10 LAB — CBC WITH DIFFERENTIAL/PLATELET
Basophils Absolute: 0 10*3/uL (ref 0.0–0.2)
Basos: 0 %
EOS (ABSOLUTE): 0 10*3/uL (ref 0.0–0.4)
Eos: 1 %
Hematocrit: 46.1 % (ref 37.5–51.0)
Hemoglobin: 15.5 g/dL (ref 13.0–17.7)
Immature Grans (Abs): 0 10*3/uL (ref 0.0–0.1)
Immature Granulocytes: 0 %
Lymphocytes Absolute: 1.8 10*3/uL (ref 0.7–3.1)
Lymphs: 34 %
MCH: 29.8 pg (ref 26.6–33.0)
MCHC: 33.6 g/dL (ref 31.5–35.7)
MCV: 89 fL (ref 79–97)
Monocytes Absolute: 0.7 10*3/uL (ref 0.1–0.9)
Monocytes: 14 %
Neutrophils Absolute: 2.6 10*3/uL (ref 1.4–7.0)
Neutrophils: 51 %
Platelets: 155 10*3/uL (ref 150–450)
RBC: 5.21 x10E6/uL (ref 4.14–5.80)
RDW: 13.6 % (ref 11.6–15.4)
WBC: 5.1 10*3/uL (ref 3.4–10.8)

## 2021-04-10 LAB — LIPID PANEL WITH LDL/HDL RATIO
Cholesterol, Total: 170 mg/dL (ref 100–199)
HDL: 47 mg/dL (ref >39)
LDL Chol Calc (NIH): 111 mg/dL — ABNORMAL HIGH (ref 0–99)
LDL/HDL Ratio: 2.4 ratio (ref 0.0–3.6)
Triglycerides: 64 mg/dL (ref 0–149)
VLDL Cholesterol Cal: 12 mg/dL (ref 5–40)

## 2021-04-10 LAB — COMPREHENSIVE METABOLIC PANEL
ALT: 12 IU/L (ref 0–44)
AST: 22 IU/L (ref 0–40)
Albumin/Globulin Ratio: 1.7 (ref 1.2–2.2)
Albumin: 4 g/dL (ref 3.8–4.9)
Alkaline Phosphatase: 48 IU/L (ref 44–121)
BUN/Creatinine Ratio: 11 (ref 9–20)
BUN: 16 mg/dL (ref 6–24)
Bilirubin Total: 0.6 mg/dL (ref 0.0–1.2)
CO2: 22 mmol/L (ref 20–29)
Calcium: 9.1 mg/dL (ref 8.7–10.2)
Chloride: 102 mmol/L (ref 96–106)
Creatinine, Ser: 1.46 mg/dL — ABNORMAL HIGH (ref 0.76–1.27)
Globulin, Total: 2.3 g/dL (ref 1.5–4.5)
Glucose: 88 mg/dL (ref 70–99)
Potassium: 4.4 mmol/L (ref 3.5–5.2)
Sodium: 138 mmol/L (ref 134–144)
Total Protein: 6.3 g/dL (ref 6.0–8.5)
eGFR: 58 mL/min/{1.73_m2} — ABNORMAL LOW (ref >59)

## 2021-04-10 LAB — B12 AND FOLATE PANEL
Folate: 13.3 ng/mL (ref >3.0)
Vitamin B-12: 641 pg/mL (ref 232–1245)

## 2021-04-10 LAB — TSH+FREE T4
Free T4: 1.18 ng/dL (ref 0.82–1.77)
TSH: 1.93 u[IU]/mL (ref 0.450–4.500)

## 2021-04-11 ENCOUNTER — Telehealth: Payer: Self-pay

## 2021-04-11 NOTE — Telephone Encounter (Signed)
-----   Message from Mylinda Latina, PA-C sent at 04/10/2021  3:14 PM EDT ----- ?Please let him know that his cholesterol has improved some and his renal function continues to be abnormal but stable from previous ?

## 2021-04-11 NOTE — Telephone Encounter (Signed)
Spoke with patient regarding lab results on 04/11/2021. ?

## 2021-05-10 ENCOUNTER — Encounter: Payer: Self-pay | Admitting: Physician Assistant

## 2021-05-10 ENCOUNTER — Ambulatory Visit (INDEPENDENT_AMBULATORY_CARE_PROVIDER_SITE_OTHER): Payer: BC Managed Care – PPO | Admitting: Physician Assistant

## 2021-05-10 DIAGNOSIS — Z0001 Encounter for general adult medical examination with abnormal findings: Secondary | ICD-10-CM | POA: Diagnosis not present

## 2021-05-10 DIAGNOSIS — N281 Cyst of kidney, acquired: Secondary | ICD-10-CM | POA: Diagnosis not present

## 2021-05-10 DIAGNOSIS — E7800 Pure hypercholesterolemia, unspecified: Secondary | ICD-10-CM | POA: Diagnosis not present

## 2021-05-10 DIAGNOSIS — R300 Dysuria: Secondary | ICD-10-CM | POA: Diagnosis not present

## 2021-05-10 DIAGNOSIS — R0989 Other specified symptoms and signs involving the circulatory and respiratory systems: Secondary | ICD-10-CM

## 2021-05-10 MED ORDER — ZOSTER VAC RECOMB ADJUVANTED 50 MCG/0.5ML IM SUSR: 0.5000 mL | Freq: Once | INTRAMUSCULAR | 0 refills | Status: AC

## 2021-05-10 NOTE — Progress Notes (Signed)
?Agency ?[address] ?[place], [place] [address] ? ?Internal MEDICINE  ?Office Visit Note ? ?Patient Name: Ricky Ross ? [id number]  ?[id number] ? ?Date of Service: 05/10/2021 ? ?Chief Complaint  ?Patient presents with  ? Annual Exam  ?  Would like to review lab results from 04/09/21  ? Quality Metric Gaps  ?  Shingles Vaccine  ? ? ? ?HPI ?Pt is here for routine health maintenance examination ?-When he urinates, sometimes takes a little longer, a little slower, but otherwise no concerns. PSA checked last year and was normal.  ?-Reviewed labs, cholesterol improving, but still elevated. Improving diet and exercise. Will also start fish oil. His renal function is also still abnormal but stable from last few years. He has a known renal cyst that has been stable on repeat imaging. Never needed to see nephrology in the past. ?-Discussed checking carotid US for further evaluation ?-Saw neurology regarding vision changes and they did not find anything. Per chart they attributed symptoms to possible migraines etiology however pt denies any headaches. Did discuss possible MRI but deferred at the time. They has also recommended HST due to snoring, but already had one a few years ago and was negative and denies any changes to sleep. ?-up to date on colonoscopy, due for shingles vaccines ? ?Current Medication: ?Outpatient Encounter Medications as of 05/10/2021  ?Medication Sig  ? Multiple Vitamin (MULTI-VITAMINS) TABS Take by mouth.  ? [DISCONTINUED] Zoster Vaccine Adjuvanted [facility]) injection Inject 0.5 mLs into the muscle once.  ? Zoster Vaccine Adjuvanted [facility]) injection Inject 0.5 mLs into the muscle once for 1 dose.  ? ?No facility-administered encounter medications on file as of 05/10/2021.  ? ? ?Surgical History: ?Past Surgical History:  ?Procedure Laterality Date  ? CARDIAC CATHETERIZATION N/A 03/22/2015  ? Procedure: Right/Left Heart Cath and Coronary Angiography;  Surgeon: Corey Skains, MD;   Location: [place] [facility];  Service: Cardiovascular;  Laterality: N/A;  ? COLONOSCOPY WITH PROPOFOL N/A 11/27/2018  ? Procedure: COLONOSCOPY WITH PROPOFOL;  Surgeon: Virgel Manifold, MD;  Location: [facility] ENDOS[facility];  Service: Endoscopy;  Laterality: N/A;  ? MITRAL VALVE REPAIR    ? TEE WITHOUT CARDIOVERSION N/A 03/22/2015  ? Procedure: TRANSESOPHAGEAL ECHOCARDIOGRAM (TEE);  Surgeon: Corey Skains, MD;  Location: [facility] ORS;  Service: Cardiovascular;  Laterality: N/A;  ? ? ?Medical History: ?Past Medical History:  ?Diagnosis Date  ? Heart murmur   ? Mitral valve disorder   ? ? ?Family History: ?Family History  ?Family history unknown: Yes  ? ? ? ? ?Review of Systems  ?Constitutional:  Negative for chills, fatigue and unexpected weight change.  ?HENT:  Negative for congestion, postnasal drip, rhinorrhea, sneezing and sore throat.   ?Eyes:  Negative for redness.  ?Respiratory:  Negative for cough, chest tightness and shortness of breath.   ?Cardiovascular:  Negative for chest pain and palpitations.  ?Gastrointestinal:  Negative for abdominal pain, constipation, diarrhea, nausea and vomiting.  ?Genitourinary:  Negative for dysuria and frequency.  ?Musculoskeletal:  Negative for arthralgias, back pain, joint swelling and neck pain.  ?Skin:  Negative for rash.  ?Neurological: Negative.  Negative for tremors and numbness.  ?Hematological:  Negative for adenopathy. Does not bruise/bleed easily.  ?Psychiatric/Behavioral:  Negative for behavioral problems (Depression), sleep disturbance and suicidal ideas. The patient is not nervous/anxious.   ? ? ?Vital Signs: ?BP 129/83   Pulse 80   Temp 97.7 ?F (36.5 ?C)   Resp 16   Ht 5' 9.5" (1.765 m)  Wt 218 lb 9.6 oz (99.2 kg)   SpO2 99%   BMI 31.82 kg/m?  ? ? ?Physical Exam ?Vitals and nursing note reviewed.  ?Constitutional:   ?   General: He is not in acute distress. ?   Appearance: He is well-developed. He is obese. He is not diaphoretic.  ?HENT:  ?   Head:  Normocephalic and atraumatic.  ?   Mouth/Throat:  ?   Pharynx: No oropharyngeal exudate.  ?Eyes:  ?   Extraocular Movements: Extraocular movements intact.  ?   Pupils: Pupils are equal, round, and reactive to light.  ?Neck:  ?   Thyroid: No thyromegaly.  ?   Vascular: No JVD.  ?   Trachea: No tracheal deviation.  ?Cardiovascular:  ?   Rate and Rhythm: Normal rate and regular rhythm.  ?   Heart sounds: Normal heart sounds. No murmur heard. ?  No friction rub. No gallop.  ?Pulmonary:  ?   Effort: Pulmonary effort is normal. No respiratory distress.  ?   Breath sounds: No wheezing or rales.  ?Chest:  ?   Chest wall: No tenderness.  ?Abdominal:  ?   General: Bowel sounds are normal.  ?   Palpations: Abdomen is soft.  ?   Tenderness: There is no abdominal tenderness.  ?Musculoskeletal:     ?   General: Normal range of motion.  ?   Cervical back: Normal range of motion and neck supple.  ?Lymphadenopathy:  ?   Cervical: No cervical adenopathy.  ?Skin: ?   General: Skin is warm and dry.  ?Neurological:  ?   Mental Status: He is alert and oriented to person, place, and time.  ?   Cranial Nerves: No cranial nerve deficit.  ?Psychiatric:     ?   Behavior: Behavior normal.     ?   Thought Content: Thought content normal.     ?   Judgment: Judgment normal.  ? ? ? ?LABS: ?Recent Results (from the past 2160 hour(s))  ?Lipid Panel With LDL/HDL Ratio     Status: Abnormal  ? Collection Time: 04/09/21  7:57 AM  ?Result Value Ref Range  ? Cholesterol, Total 170 100 - 199 mg/dL  ? Triglycerides 64 0 - 149 mg/dL  ? HDL 47 >39 mg/dL  ? VLDL Cholesterol Cal 12 5 - 40 mg/dL  ? LDL Chol Calc (NIH) 111 (H) 0 - 99 mg/dL  ? LDL/HDL Ratio 2.4 0.0 - 3.6 ratio  ?  Comment:                                     LDL/HDL Ratio ?                                            Men  Women ?                              1/2 Avg.Risk  1.0    1.5 ?                                  Avg.Risk  3.6    3.2 ?  2X Avg.Risk  6.2    5.0 ?                                3X Avg.Risk  8.0    6.1 ?  ?Comprehensive metabolic panel     Status: Abnormal  ? Collection Time: 04/09/21  7:57 AM  ?Result Value Ref Range  ? Glucose 88 70 - 99 mg/dL  ? BUN 16 6 - 24 mg/dL  ? Creatinine, Ser 1.46 (H) 0.76 - 1.27 mg/dL  ? eGFR 58 (L) >59 mL/min/1.73  ? BUN/Creatinine Ratio 11 9 - 20  ? Sodium 138 134 - 144 mmol/L  ? Potassium 4.4 3.5 - 5.2 mmol/L  ? Chloride 102 96 - 106 mmol/L  ? CO2 22 20 - 29 mmol/L  ? Calcium 9.1 8.7 - 10.2 mg/dL  ? Total Protein 6.3 6.0 - 8.5 g/dL  ? Albumin 4.0 3.8 - 4.9 g/dL  ? Globulin, Total 2.3 1.5 - 4.5 g/dL  ? Albumin/Globulin Ratio 1.7 1.2 - 2.2  ? Bilirubin Total 0.6 0.0 - 1.2 mg/dL  ? Alkaline Phosphatase 48 44 - 121 IU/L  ? AST 22 0 - 40 IU/L  ? ALT 12 0 - 44 IU/L  ?CBC w/Diff/Platelet     Status: None  ? Collection Time: 04/09/21  7:57 AM  ?Result Value Ref Range  ? WBC 5.1 3.4 - 10.8 x10E3/uL  ? RBC 5.21 4.14 - 5.80 x10E6/uL  ? Hemoglobin 15.5 13.0 - 17.7 g/dL  ? Hematocrit 46.1 37.5 - 51.0 %  ? MCV 89 79 - 97 fL  ? MCH 29.8 26.6 - 33.0 pg  ? MCHC 33.6 31.5 - 35.7 g/dL  ? RDW 13.6 11.6 - 15.4 %  ? Platelets 155 150 - 450 x10E3/uL  ? Neutrophils 51 Not Estab. %  ? Lymphs 34 Not Estab. %  ? Monocytes 14 Not Estab. %  ? Eos 1 Not Estab. %  ? Basos 0 Not Estab. %  ? Neutrophils Absolute 2.6 1.4 - 7.0 x10E3/uL  ? Lymphocytes Absolute 1.8 0.7 - 3.1 x10E3/uL  ? Monocytes Absolute 0.7 0.1 - 0.9 x10E3/uL  ? EOS (ABSOLUTE) 0.0 0.0 - 0.4 x10E3/uL  ? Basophils Absolute 0.0 0.0 - 0.2 x10E3/uL  ? Immature Granulocytes 0 Not Estab. %  ? Immature Grans (Abs) 0.0 0.0 - 0.1 x10E3/uL  ?TSH + free T4     Status: None  ? Collection Time: 04/09/21  7:57 AM  ?Result Value Ref Range  ? TSH 1.930 0.450 - 4.500 uIU/mL  ? Free T4 1.18 0.82 - 1.77 ng/dL  ?B12 and Folate Panel     Status: None  ? Collection Time: 04/09/21  7:57 AM  ?Result Value Ref Range  ? Vitamin B-12 641 232 - 1,245 pg/mL  ? Folate 13.3 >3.0 ng/mL  ?  Comment: A serum folate concentration of  less than 3.1 ng/mL is ?considered to represent clinical deficiency. ?  ? ? ? ? ? ? ?Assessment/Plan: ?1. Encounter for general adult medical examination with abnormal findings ?CPE performed, routine fasting labs revi

## 2021-05-11 ENCOUNTER — Telehealth: Payer: Self-pay

## 2021-05-11 LAB — UA/M W/RFLX CULTURE, ROUTINE
Bilirubin, UA: NEGATIVE
Glucose, UA: NEGATIVE
Ketones, UA: NEGATIVE
Leukocytes,UA: NEGATIVE
Nitrite, UA: NEGATIVE
Protein,UA: NEGATIVE
RBC, UA: NEGATIVE
Specific Gravity, UA: 1.017 (ref 1.005–1.030)
Urobilinogen, Ur: 0.2 mg/dL (ref 0.2–1.0)
pH, UA: 6 (ref 5.0–7.5)

## 2021-05-11 LAB — MICROSCOPIC EXAMINATION
Bacteria, UA: NONE SEEN
Casts: NONE SEEN /lpf
Epithelial Cells (non renal): NONE SEEN /hpf (ref 0–10)
RBC, Urine: NONE SEEN /hpf (ref 0–2)
WBC, UA: NONE SEEN /hpf (ref 0–5)

## 2021-05-11 NOTE — Telephone Encounter (Signed)
Lvm to scheduled u/s-Toni ?

## 2021-05-14 ENCOUNTER — Telehealth: Payer: Self-pay

## 2021-05-15 ENCOUNTER — Other Ambulatory Visit: Payer: Self-pay | Admitting: Physician Assistant

## 2021-05-15 DIAGNOSIS — R39198 Other difficulties with micturition: Secondary | ICD-10-CM

## 2021-05-15 NOTE — Telephone Encounter (Signed)
Pt advised that we did referral for urologist Vivien Rota will call once done  ?

## 2021-05-22 ENCOUNTER — Ambulatory Visit: Payer: Self-pay | Admitting: Urology

## 2021-05-29 ENCOUNTER — Ambulatory Visit: Payer: Self-pay | Admitting: Urology

## 2021-06-11 ENCOUNTER — Ambulatory Visit (INDEPENDENT_AMBULATORY_CARE_PROVIDER_SITE_OTHER): Payer: BC Managed Care – PPO

## 2021-06-11 DIAGNOSIS — R0989 Other specified symptoms and signs involving the circulatory and respiratory systems: Secondary | ICD-10-CM

## 2021-06-20 NOTE — Procedures (Signed)
[place], [place] [address]  DATE OF SERVICE: Jun 11, 2021  CAROTID DOPPLER INTERPRETATION:  Bilateral Carotid Ultrsasound and Color Doppler Examination was performed. The RIGHT CCA shows minimal plaque in the vessel. The LEFT CCA shows minimal plaque in the vessel. There was no significant intimal thickening noted in the RIGHT carotid artery. There was no significant intimal thickening in the LEFT carotid artery.  The RIGHT CCA shows peak systolic velocity of 84 cm per second. The end diastolic velocity is 17 cm per second on the RIGHT side. The RIGHT ICA shows peak systolic velocity of 77 per second. RIGHT sided ICA end diastolic velocity is 35 cm per second. The RIGHT ECA shows a peak systolic velocity of 64 cm per second. The ICA/CCA ratio is calculated to be 0.9. This suggests less than 50% stenosis. The Vertebral Artery shows antegrade flow.  The LEFT CCA shows peak systolic velocity of 88 cm per second. The end diastolic velocity is 23 cm per second on the LEFT side. The LEFT ICA shows peak systolic velocity of 78 per second. LEFT sided ICA end diastolic velocity is 34 cm per second. The LEFT ECA shows a peak systolic velocity of 84 cm per second. The ICA/CCA ratio is calculated to be 0.88. This suggests less than 50% stenosis. The Vertebral Artery shows antegrade flow.   Impression:    The RIGHT CAROTID shows less than 50% stenosis. The LEFT CAROTID shows less than 50% stenosis.  There is minimal plaque formation noted on the LEFT and minimal plaque on the RIGHT  side. Consider a repeat Carotid doppler if clinical situation and symptoms warrant in 6-12 months. Patient should be encouraged to change lifestyles such as smoking cessation, regular exercise and dietary modification. Use of statins in the right clinical setting and ASA is encouraged.  Allyne Gee, MD [facility] Pulmonary Critical Care Medicine

## 2021-06-21 ENCOUNTER — Encounter: Payer: Self-pay | Admitting: Internal Medicine

## 2021-07-02 ENCOUNTER — Other Ambulatory Visit: Payer: BC Managed Care – PPO

## 2021-07-12 ENCOUNTER — Ambulatory Visit: Payer: BC Managed Care – PPO | Admitting: Physician Assistant

## 2021-07-30 ENCOUNTER — Ambulatory Visit: Payer: BC Managed Care – PPO | Admitting: Physician Assistant

## 2021-08-13 ENCOUNTER — Ambulatory Visit: Payer: BC Managed Care – PPO | Admitting: Physician Assistant

## 2021-08-20 ENCOUNTER — Ambulatory Visit: Payer: BC Managed Care – PPO | Admitting: Physician Assistant

## 2021-08-20 ENCOUNTER — Encounter: Payer: Self-pay | Admitting: Physician Assistant

## 2021-08-20 VITALS — BP 116/76 | HR 98 | Temp 98.3°F | Resp 16 | Ht 69.5 in | Wt 219.6 lb

## 2021-08-20 DIAGNOSIS — E7800 Pure hypercholesterolemia, unspecified: Secondary | ICD-10-CM | POA: Diagnosis not present

## 2021-08-20 DIAGNOSIS — R39198 Other difficulties with micturition: Secondary | ICD-10-CM | POA: Diagnosis not present

## 2021-08-20 NOTE — Progress Notes (Signed)
[facility] [place], [place] [address]  Internal MEDICINE  Office Visit Note  Patient Name: Ricky Ross  [id number]  [id number]  Date of Service: 08/20/2021  Chief Complaint  Patient presents with   Follow-up    Here to discuss ultrasound    HPI Pt is here for routine follow up to review carotid US -Carotid US showed less than 50% stenosis with mild plaque bilaterally -We have been monitoring his cholesterol which is elevated but has been improving. Discussed considering crestor '5mg'$  twice per week for cholesterol reduction and CV protection, however would like to hold off and keep monitoring labs for now as he continues to work on diet and exercise -Sometimes slower urinary stream after holding for awhile. But not always. Couldn't make urology appt, but states he thinks he is ok to wait for now and monitor symptoms, but may request new referral if symptoms changing  Current Medication: Outpatient Encounter Medications as of 08/20/2021  Medication Sig   Multiple Vitamin (MULTI-VITAMINS) TABS Take by mouth.   No facility-administered encounter medications on file as of 08/20/2021.    Surgical History: Past Surgical History:  Procedure Laterality Date   CARDIAC CATHETERIZATION N/A 03/22/2015   Procedure: Right/Left Heart Cath and Coronary Angiography;  Surgeon: Corey Skains, MD;  Location: [facility];  Service: Cardiovascular;  Laterality: N/A;   COLONOSCOPY WITH PROPOFOL N/A 11/27/2018   Procedure: COLONOSCOPY WITH PROPOFOL;  Surgeon: Virgel Manifold, MD;  Location: [facility];  Service: Endoscopy;  Laterality: N/A;   MITRAL VALVE REPAIR     TEE WITHOUT CARDIOVERSION N/A 03/22/2015   Procedure: TRANSESOPHAGEAL ECHOCARDIOGRAM (TEE);  Surgeon: Corey Skains, MD;  Location: [facility];  Service: Cardiovascular;  Laterality: N/A;    Medical History: Past Medical History:  Diagnosis Date   Heart murmur    Mitral valve disorder      Family History: Family History  Family history unknown: Yes    Social History   Socioeconomic History   Marital status: Married    Spouse name: Not on file   Number of children: Not on file   Years of education: Not on file   Highest education level: Not on file  Occupational History   Not on file  Tobacco Use   Smoking status: Never   Smokeless tobacco: Never  Vaping Use   Vaping Use: Never used  Substance and Sexual Activity   Alcohol use: No   Drug use: No   Sexual activity: Not on file  Other Topics Concern   Not on file  Social History Narrative   Not on file   Social Determinants of Health   Financial Resource Strain: Not on file  Food Insecurity: Not on file  Transportation Needs: Not on file  Physical Activity: Not on file  Stress: Not on file  Social Connections: Not on file  Intimate Partner Violence: Not on file      Review of Systems  Constitutional:  Negative for chills, fatigue and unexpected weight change.  HENT:  Negative for congestion, postnasal drip, rhinorrhea, sneezing and sore throat.   Eyes:  Negative for redness.  Respiratory:  Negative for cough, chest tightness and shortness of breath.   Cardiovascular:  Negative for chest pain and palpitations.  Gastrointestinal:  Negative for abdominal pain, constipation, diarrhea, nausea and vomiting.  Genitourinary:  Negative for dysuria and frequency.  Musculoskeletal:  Negative for arthralgias, back pain, joint swelling and neck pain.  Skin:  Negative for  rash.  Neurological: Negative.  Negative for tremors and numbness.  Hematological:  Negative for adenopathy. Does not bruise/bleed easily.  Psychiatric/Behavioral:  Negative for behavioral problems (Depression), sleep disturbance and suicidal ideas. The patient is not nervous/anxious.     Vital Signs: BP 116/76   Pulse 98   Temp 98.3 F (36.8 C)   Resp 16   Ht 5' 9.5" (1.765 m)   Wt 219 lb 9.6 oz (99.6 kg)   SpO2 96%   BMI 31.96  kg/m    Physical Exam Vitals and nursing note reviewed.  Constitutional:      General: He is not in acute distress.    Appearance: He is well-developed. He is obese. He is not diaphoretic.  HENT:     Head: Normocephalic and atraumatic.     Mouth/Throat:     Pharynx: No oropharyngeal exudate.  Eyes:     Extraocular Movements: Extraocular movements intact.     Pupils: Pupils are equal, round, and reactive to light.  Neck:     Thyroid: No thyromegaly.     Vascular: No JVD.     Trachea: No tracheal deviation.  Cardiovascular:     Rate and Rhythm: Normal rate and regular rhythm.     Heart sounds: Normal heart sounds. No murmur heard.    No friction rub. No gallop.  Pulmonary:     Effort: Pulmonary effort is normal. No respiratory distress.     Breath sounds: No wheezing or rales.  Chest:     Chest wall: No tenderness.  Abdominal:     General: Bowel sounds are normal.     Palpations: Abdomen is soft.     Tenderness: There is no abdominal tenderness.  Musculoskeletal:        General: Normal range of motion.     Cervical back: Normal range of motion and neck supple.  Lymphadenopathy:     Cervical: No cervical adenopathy.  Skin:    General: Skin is warm and dry.  Neurological:     Mental Status: He is alert and oriented to person, place, and time.     Cranial Nerves: No cranial nerve deficit.  Psychiatric:        Behavior: Behavior normal.        Thought Content: Thought content normal.        Judgment: Judgment normal.        Assessment/Plan: 1. Hypercholesteremia Continue to work on diet and exercise and reconsider statin in future  2. Difficulty urinating Seems to only happen sometimes, therefore will monitor symptoms and consider new referral to urology if needed   General Counseling: Ricky Ross verbalizes understanding of the findings of todays visit and agrees with plan of treatment. I have discussed any further diagnostic evaluation that may be needed or ordered  today. We also reviewed his medications today. he has been encouraged to call the office with any questions or concerns that should arise related to todays visit.    No orders of the defined types were placed in this encounter.   No orders of the defined types were placed in this encounter.   This patient was seen by Drema Dallas, PA-C in collaboration with Dr. Clayborn Bigness as a part of collaborative care agreement.   Total time spent:30 Minutes Time spent includes review of chart, medications, test results, and follow up plan with the patient.      Dr Lavera Guise Internal medicine

## 2021-10-08 ENCOUNTER — Telehealth: Payer: Self-pay | Admitting: Physician Assistant

## 2021-10-10 ENCOUNTER — Telehealth: Payer: Self-pay | Admitting: Physician Assistant

## 2021-10-10 ENCOUNTER — Other Ambulatory Visit: Payer: Self-pay | Admitting: Physician Assistant

## 2021-10-10 DIAGNOSIS — R141 Gas pain: Secondary | ICD-10-CM

## 2021-10-10 NOTE — Telephone Encounter (Signed)
Patient had requested GI referral. Lvm for him to return my call for reason-Ricky Ross

## 2021-10-11 NOTE — Telephone Encounter (Signed)
Error

## 2021-10-19 ENCOUNTER — Ambulatory Visit (INDEPENDENT_AMBULATORY_CARE_PROVIDER_SITE_OTHER): Payer: BC Managed Care – PPO | Admitting: Physician Assistant

## 2021-10-19 ENCOUNTER — Encounter: Payer: Self-pay | Admitting: Physician Assistant

## 2021-10-19 VITALS — BP 124/82 | HR 61 | Temp 97.8°F | Resp 16 | Ht 69.5 in | Wt 209.0 lb

## 2021-10-19 DIAGNOSIS — M25562 Pain in left knee: Secondary | ICD-10-CM

## 2021-10-19 NOTE — Progress Notes (Signed)
[facility] [place], [place] [address]  Internal MEDICINE  Office Visit Note  Patient Name: Ricky Ross  [id number]  [id number]  Date of Service: 10/19/2021  Chief Complaint  Patient presents with   Acute Visit   Knee Pain    Left      HPI Pt is here for a sick visit. -Last Friday he was at the gym and twisted his left knee. Unsure if maybe he heard something. Pain started after turning to the right and twisting left knee. Stopped work out after this occurred -Starts out the day feeling ok, but by late afternoon knee will get sore after walking on it all day. Doesn't feel it will give out on him or feel unstable. Not swollen. ROM has been normal. Has not really impacted his walking. -Has been icing it when he gets home from work. Has not taken any anti-inflammatories  Current Medication:  Outpatient Encounter Medications as of 10/19/2021  Medication Sig   Multiple Vitamin (MULTI-VITAMINS) TABS Take by mouth.   No facility-administered encounter medications on file as of 10/19/2021.      Medical History: Past Medical History:  Diagnosis Date   Heart murmur    Mitral valve disorder      Vital Signs: BP 124/82   Pulse 61   Temp 97.8 F (36.6 C)   Resp 16   Ht 5' 9.5" (1.765 m)   Wt 209 lb (94.8 kg)   SpO2 99%   BMI 30.42 kg/m    Review of Systems  Constitutional:  Negative for fatigue and fever.  HENT:  Negative for congestion, mouth sores and postnasal drip.   Respiratory:  Negative for cough.   Cardiovascular:  Negative for chest pain.  Genitourinary:  Negative for flank pain.  Musculoskeletal:  Positive for arthralgias.  Psychiatric/Behavioral: Negative.      Physical Exam Vitals and nursing note reviewed.  Constitutional:      Appearance: Normal appearance.  HENT:     Head: Normocephalic and atraumatic.  Cardiovascular:     Rate and Rhythm: Normal rate and regular rhythm.  Pulmonary:     Effort: Pulmonary effort is  normal.     Breath sounds: Normal breath sounds.  Musculoskeletal:        General: Tenderness present. No swelling.     Cervical back: Normal range of motion.     Comments: Mild tenderness along inferior medial left knee. No swelling or deformity. Full ROM without pain.  Skin:    General: Skin is warm and dry.  Neurological:     General: No focal deficit present.     Mental Status: He is alert.  Psychiatric:        Mood and Affect: Mood normal.        Behavior: Behavior normal.        Thought Content: Thought content normal.        Judgment: Judgment normal.       Assessment/Plan: 1. Acute pain of left knee No swelling, deformity, or laxity on exam and full ROM intact. Will continue to rest, ice, and will start on anti-inflammatory--advil or aleve. If not improving or any worsening may consider xray vs ortho and pt advised to call office or go to [facility].   General Counseling: Rome verbalizes understanding of the findings of todays visit and agrees with plan of treatment. I have discussed any further diagnostic evaluation that may be needed or ordered today. We also reviewed his medications today. he  has been encouraged to call the office with any questions or concerns that should arise related to todays visit.    Counseling:    No orders of the defined types were placed in this encounter.   No orders of the defined types were placed in this encounter.   Time spent:30 Minutes

## 2021-12-11 DIAGNOSIS — M25562 Pain in left knee: Secondary | ICD-10-CM | POA: Insufficient documentation

## 2021-12-11 DIAGNOSIS — M2392 Unspecified internal derangement of left knee: Secondary | ICD-10-CM | POA: Insufficient documentation

## 2022-02-21 ENCOUNTER — Encounter: Payer: Self-pay | Admitting: Gastroenterology

## 2022-02-21 ENCOUNTER — Ambulatory Visit: Payer: BC Managed Care – PPO | Admitting: Gastroenterology

## 2022-02-21 ENCOUNTER — Telehealth: Payer: Self-pay | Admitting: Gastroenterology

## 2022-02-21 NOTE — Telephone Encounter (Signed)
Pt was no show for 02/21/2022 letter was sent

## 2022-02-21 NOTE — Progress Notes (Deleted)
Jonathon Bellows MD, MRCP(U.K) [address]  [place]  [place], [place] [address]  Main: [phone]  Fax: [phone]   Primary Care Physician: Lavera Guise, MD  Primary Gastroenterologist:  Dr. Jonathon Bellows   No chief complaint on file.   HPI: Ceasar Decandia is a 52 y.o. male   Summary of history :   He is a patient who used to see Dr. Bonna Gains last seen at the office in April 2021 for diarrhea gas and reflux.  H. pylori testing was negative was placed on the trial of PPI.  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   Interval history   April 2021-02/21/2022 In March 2023 CBC TSH B12 folate were normal.  CMP showed an elevated creatinine of 1.46 ***   Current Outpatient Medications  Medication Sig Dispense Refill   Multiple Vitamin (MULTI-VITAMINS) TABS Take by mouth.     No current facility-administered medications for this visit.    Allergies as of 02/21/2022 - Review Complete 10/19/2021  Allergen Reaction Noted   Milk (cow) Hives 11/18/2014   Milk-related compounds Hives 03/22/2015    ROS:  General: Negative for anorexia, weight loss, fever, chills, fatigue, weakness. ENT: Negative for hoarseness, difficulty swallowing , nasal congestion. CV: Negative for chest pain, angina, palpitations, dyspnea on exertion, peripheral edema.  Respiratory: Negative for dyspnea at rest, dyspnea on exertion, cough, sputum, wheezing.  GI: See history of present illness. GU:  Negative for dysuria, hematuria, urinary incontinence, urinary frequency, nocturnal urination.  Endo: Negative for unusual weight change.    Physical Examination:   There were no vitals taken for this visit.  General: Well-nourished, well-developed in no acute distress.  Eyes: No icterus. Conjunctivae pink. Mouth: Oropharyngeal mucosa moist and pink , no lesions erythema or exudate. Lungs: Clear to  auscultation bilaterally. Non-labored. Heart: Regular rate and rhythm, no murmurs rubs or gallops.  Abdomen: Bowel sounds are normal, nontender, nondistended, no hepatosplenomegaly or masses, no abdominal bruits or hernia , no rebound or guarding.   Extremities: No lower extremity edema. No clubbing or deformities. Neuro: Alert and oriented x 3.  Grossly intact. Skin: Warm and dry, no jaundice.   Psych: Alert and cooperative, normal mood and affect.   Imaging Studies: No results found.  Assessment and Plan:   Derrik Mceachern is a 52 y.o. y/o male ***    Dr Jonathon Bellows  MD,MRCP [facility]) Follow up in ***

## 2022-03-19 ENCOUNTER — Encounter: Payer: Self-pay | Admitting: Nurse Practitioner

## 2022-03-19 ENCOUNTER — Ambulatory Visit (INDEPENDENT_AMBULATORY_CARE_PROVIDER_SITE_OTHER): Payer: BC Managed Care – PPO | Admitting: Nurse Practitioner

## 2022-03-19 VITALS — BP 132/84 | HR 74 | Temp 98.5°F | Resp 16 | Ht 69.5 in | Wt 213.4 lb

## 2022-03-19 DIAGNOSIS — R140 Abdominal distension (gaseous): Secondary | ICD-10-CM

## 2022-03-19 DIAGNOSIS — Z23 Encounter for immunization: Secondary | ICD-10-CM | POA: Diagnosis not present

## 2022-03-19 DIAGNOSIS — R197 Diarrhea, unspecified: Secondary | ICD-10-CM

## 2022-03-19 DIAGNOSIS — R141 Gas pain: Secondary | ICD-10-CM

## 2022-03-19 MED ORDER — ZOSTER VAC RECOMB ADJUVANTED 50 MCG/0.5ML IM SUSR: 0.5000 mL | Freq: Once | INTRAMUSCULAR | 0 refills | Status: AC

## 2022-03-19 MED ORDER — RIFAXIMIN 550 MG PO TABS: 550.0000 mg | ORAL_TABLET | Freq: Three times a day (TID) | ORAL | 0 refills | Status: DC

## 2022-03-19 NOTE — Progress Notes (Signed)
[facility] [place], [place] [address]  Internal MEDICINE  Office Visit Note  Patient Name: Ricky Ross  [id number]  [id number]  Date of Service: 03/19/2022  Chief Complaint  Patient presents with  . Acute Visit    Stomach issues.      HPI Ricky Ross presents for an acute sick visit for bloating and diarrhea.  --ongoing for months  Increased gas, bloating, indigestion, distention and cramping No heartburn, No nausea or vomiting Off and on diarrhea  Not sure what foods cause it Has been told he might be allergic to something.     Current Medication:  Outpatient Encounter Medications as of 03/19/2022  Medication Sig  . Multiple Vitamin (MULTI-VITAMINS) TABS Take by mouth.  . rifaximin (XIFAXAN) 550 MG TABS tablet Take 1 tablet (550 mg total) by mouth 3 (three) times daily.  . [DISCONTINUED] Zoster Vaccine Adjuvanted [facility]) injection Inject 0.5 mLs into the muscle once.  . Zoster Vaccine Adjuvanted [facility] injection Inject 0.5 mLs into the muscle once for 1 dose.   No facility-administered encounter medications on file as of 03/19/2022.      Medical History: Past Medical History:  Diagnosis Date  . Heart murmur   . Mitral valve disorder      Vital Signs: BP 132/84   Pulse 74   Temp 98.5 F (36.9 C)   Resp 16   Ht 5' 9.5" (1.765 m)   Wt 213 lb 6.4 oz (96.8 kg)   SpO2 99%   BMI 31.06 kg/m    Review of Systems  Constitutional: Negative.   Respiratory: Negative.  Negative for cough, chest tightness, shortness of breath and wheezing.   Cardiovascular: Negative.  Negative for chest pain and palpitations.  Gastrointestinal:  Positive for abdominal distention and diarrhea. Negative for abdominal pain, constipation, nausea and vomiting.  Genitourinary: Negative.   Musculoskeletal: Negative.     Physical Exam Constitutional:      General: He is not in acute distress.    Appearance: Normal appearance. He is obese. He is not  ill-appearing.  Neurological:     Mental Status: He is alert.      Assessment/Plan:   General Counseling: Brylon verbalizes understanding of the findings of todays visit and agrees with plan of treatment. I have discussed any further diagnostic evaluation that may be needed or ordered today. We also reviewed his medications today. he has been encouraged to call the office with any questions or concerns that should arise related to todays visit.    Counseling:    Orders Placed This Encounter  Procedures  . IgE Food w/Component Reflex II    Meds ordered this encounter  Medications  . Zoster Vaccine Adjuvanted [facility]) injection    Sig: Inject 0.5 mLs into the muscle once for 1 dose.    Dispense:  0.5 mL    Refill:  0  . rifaximin (XIFAXAN) 550 MG TABS tablet    Sig: Take 1 tablet (550 mg total) by mouth 3 (three) times daily.    Dispense:  42 tablet    Refill:  0    Return if symptoms worsen or fail to improve.  [place] Controlled Substance Database was reviewed by me for overdose risk score (ORS)  Time spent:*** Minutes Time spent with patient included reviewing progress notes, labs, imaging studies, and discussing plan for follow up.   This patient was seen by Jonetta Osgood, FNP-C in collaboration with Dr. Clayborn Bigness as a part of collaborative care agreement.  Dray Dente [name], MSN, FNP-C Internal Medicine

## 2022-03-20 ENCOUNTER — Encounter: Payer: Self-pay | Admitting: Nurse Practitioner

## 2022-03-22 LAB — IGE FOOD W/COMPONENT REFLEX II
Allergen Corn, IgE: <0.1 kU/L
Clam IgE: <0.1 kU/L
Codfish IgE: <0.1 kU/L
F001-IgE Egg White: <0.1 kU/L
F002-IgE Milk: <0.1 kU/L
F017-IgE Hazelnut (Filbert): <0.1 kU/L
F018-IgE Brazil Nut: <0.1 kU/L
F020-IgE Almond: <0.1 kU/L
F202-IgE Cashew Nut: <0.1 kU/L
F203-IgE Pistachio Nut: <0.1 kU/L
F256-IgE Walnut: <0.1 kU/L
Macadamia Nut, IgE: <0.1 kU/L
Peanut, IgE: <0.1 kU/L
Pecan Nut IgE: <0.1 kU/L
Scallop IgE: <0.1 kU/L
Sesame Seed IgE: <0.1 kU/L
Shrimp IgE: <0.1 kU/L
Soybean IgE: <0.1 kU/L
Wheat IgE: <0.1 kU/L

## 2022-03-26 ENCOUNTER — Ambulatory Visit (INDEPENDENT_AMBULATORY_CARE_PROVIDER_SITE_OTHER): Payer: BC Managed Care – PPO | Admitting: Nurse Practitioner

## 2022-03-26 ENCOUNTER — Encounter: Payer: Self-pay | Admitting: Nurse Practitioner

## 2022-03-26 VITALS — BP 134/84 | HR 70 | Temp 96.2°F | Resp 16 | Ht 69.5 in | Wt 213.0 lb

## 2022-03-26 DIAGNOSIS — R197 Diarrhea, unspecified: Secondary | ICD-10-CM | POA: Diagnosis not present

## 2022-03-26 DIAGNOSIS — R141 Gas pain: Secondary | ICD-10-CM | POA: Diagnosis not present

## 2022-03-26 DIAGNOSIS — R140 Abdominal distension (gaseous): Secondary | ICD-10-CM | POA: Diagnosis not present

## 2022-03-26 NOTE — Progress Notes (Signed)
[facility] [place], [place] [address]  Internal MEDICINE  Office Visit Note  Patient Name: Ricky Ross  [id number]  [id number]  Date of Service: 03/26/2022  Chief Complaint  Patient presents with   Follow-up    Review labs    HPI Ricky Ross presents for a follow-up visit for GI symptoms, gas, cramping and bloating as before.  Blood work showed no food allergies  Taking xifaxan, has not noticed a difference yet.  Symptoms wax and wane.    Current Medication: Outpatient Encounter Medications as of 03/26/2022  Medication Sig   Multiple Vitamin (MULTI-VITAMINS) TABS Take by mouth.   rifaximin (XIFAXAN) 550 MG TABS tablet Take 1 tablet (550 mg total) by mouth 3 (three) times daily.   No facility-administered encounter medications on file as of 03/26/2022.    Surgical History: Past Surgical History:  Procedure Laterality Date   CARDIAC CATHETERIZATION N/A 03/22/2015   Procedure: Right/Left Heart Cath and Coronary Angiography;  Surgeon: Ricky Skains, MD;  Location: [facility];  Service: Cardiovascular;  Laterality: N/A;   COLONOSCOPY WITH PROPOFOL N/A 11/27/2018   Procedure: COLONOSCOPY WITH PROPOFOL;  Surgeon: Ricky Manifold, MD;  Location: [facility] ENDOSCOPY;  Service: Endoscopy;  Laterality: N/A;   MITRAL VALVE REPAIR     TEE WITHOUT CARDIOVERSION N/A 03/22/2015   Procedure: TRANSESOPHAGEAL ECHOCARDIOGRAM (TEE);  Surgeon: Ricky Skains, MD;  Location: [facility] ORS;  Service: Cardiovascular;  Laterality: N/A;    Medical History: Past Medical History:  Diagnosis Date   Heart murmur    Mitral valve disorder     Family History: Family History  Family history unknown: Yes    Social History   Socioeconomic History   Marital status: Married    Spouse name: Not on file   Number of children: Not on file   Years of education: Not on file   Highest education level: Not on file  Occupational History   Not on file  Tobacco Use    Smoking status: Never   Smokeless tobacco: Never  Vaping Use   Vaping Use: Never used  Substance and Sexual Activity   Alcohol use: No   Drug use: No   Sexual activity: Not on file  Other Topics Concern   Not on file  Social History Narrative   Not on file   Social Determinants of Health   Financial Resource Strain: Not on file  Food Insecurity: Not on file  Transportation Needs: Not on file  Physical Activity: Not on file  Stress: Not on file  Social Connections: Not on file  Intimate Partner Violence: Not on file      Review of Systems  Constitutional: Negative.  Negative for fatigue.  Respiratory: Negative.  Negative for cough, chest tightness, shortness of breath and wheezing.   Cardiovascular: Negative.  Negative for chest pain and palpitations.  Gastrointestinal:  Positive for abdominal distention and diarrhea. Negative for abdominal pain, constipation, nausea and vomiting.  Genitourinary: Negative.   Musculoskeletal: Negative.     Vital Signs: BP 134/84   Pulse 70   Temp (!) 96.2 F (35.7 C)   Resp 16   Ht 5' 9.5" (1.765 m)   Wt 213 lb (96.6 kg)   SpO2 98%   BMI 31.00 kg/m    Physical Exam Vitals reviewed.  Constitutional:      General: He is not in acute distress.    Appearance: Normal appearance. He is obese. He is not ill-appearing.  HENT:  Head: Normocephalic and atraumatic.  Eyes:     Pupils: Pupils are equal, round, and reactive to light.  Cardiovascular:     Rate and Rhythm: Normal rate and regular rhythm.  Pulmonary:     Effort: Pulmonary effort is normal. No respiratory distress.  Abdominal:     General: Bowel sounds are normal.     Palpations: Abdomen is soft.     Tenderness: There is no abdominal tenderness.  Neurological:     Mental Status: He is alert and oriented to person, place, and time.  Psychiatric:        Mood and Affect: Mood normal.        Behavior: Behavior normal.        Assessment/Plan: 1. Abdominal  bloating Provided [facility] phone number to patient, he will call to reschedule his appointment Continue taking xifaxan until gone.   2. Diarrhea of presumed infectious origin May take OTC loperamide for diarrhea. Continue xifaxan until gone  3. Abdominal gas pain May take simethicone OTC for gas pains.    General Counseling: Keoki verbalizes understanding of the findings of todays visit and agrees with plan of treatment. I have discussed any further diagnostic evaluation that may be needed or ordered today. We also reviewed his medications today. he has been encouraged to call the office with any questions or concerns that should arise related to todays visit.    No orders of the defined types were placed in this encounter.   No orders of the defined types were placed in this encounter.   Return if symptoms worsen or fail to improve.   Total time spent:20 Minutes Time spent includes review of chart, medications, test results, and follow up plan with the patient.    Controlled Substance Database was reviewed by me.  This patient was seen by Ricky Osgood, FNP-C in collaboration with Dr. Clayborn Ross as a part of collaborative care agreement.   Ricky Ross R. Ricky Fuller, MSN, FNP-C Internal medicine

## 2022-05-13 ENCOUNTER — Encounter: Payer: BC Managed Care – PPO | Admitting: Physician Assistant

## 2022-05-20 ENCOUNTER — Encounter: Payer: Self-pay | Admitting: Physician Assistant

## 2022-05-20 ENCOUNTER — Ambulatory Visit (INDEPENDENT_AMBULATORY_CARE_PROVIDER_SITE_OTHER): Payer: BC Managed Care – PPO | Admitting: Physician Assistant

## 2022-05-20 VITALS — BP 130/80 | HR 81 | Temp 97.8°F | Resp 16 | Ht 69.5 in | Wt 216.0 lb

## 2022-05-20 DIAGNOSIS — H532 Diplopia: Secondary | ICD-10-CM

## 2022-05-20 DIAGNOSIS — R300 Dysuria: Secondary | ICD-10-CM

## 2022-05-20 DIAGNOSIS — R5383 Other fatigue: Secondary | ICD-10-CM | POA: Diagnosis not present

## 2022-05-20 DIAGNOSIS — E538 Deficiency of other specified B group vitamins: Secondary | ICD-10-CM

## 2022-05-20 DIAGNOSIS — E7800 Pure hypercholesterolemia, unspecified: Secondary | ICD-10-CM | POA: Diagnosis not present

## 2022-05-20 DIAGNOSIS — Z0001 Encounter for general adult medical examination with abnormal findings: Secondary | ICD-10-CM | POA: Diagnosis not present

## 2022-05-20 DIAGNOSIS — Z125 Encounter for screening for malignant neoplasm of prostate: Secondary | ICD-10-CM

## 2022-05-20 MED ORDER — TOPIRAMATE 25 MG PO TABS: 25.0000 mg | ORAL_TABLET | Freq: Two times a day (BID) | ORAL | 0 refills | Status: DC

## 2022-05-20 NOTE — Progress Notes (Signed)
[facility] [address] [place], [place] [address]  Internal MEDICINE  Office Visit Note  Patient Name: Ricky Ross  [id number]  [id number]  Date of Service: 05/20/2022  Chief Complaint  Patient presents with   Annual Exam     HPI Pt is here for routine health maintenance examination -Still having the burred/double vision intermittently. Has been going on for a few years without change. Has to close one eye for awhile to try to correct double vision. Can happen when driving during the day or if working on computer. Can be 2-3 weeks in between instances so hard to track. -Eye doctor did not find anything -Neurology documented likely due to migraine and recommended magnesium and sleep study. Pt had already had sleep study previously and declined repeating and never started on magnesium. He denies having any headaches and therefore did not see the connection. Discussed patients can have wide range of atypical migraines and not all have classic headache, but will have vision impacted still. -Neurology had also discussed possible MRI but decided to hold off. Has not been back to see them in 1 year now -Discussed trialing medication for migraine prevention -due for labs and shingles vaccine  Current Medication: Outpatient Encounter Medications as of 05/20/2022  Medication Sig   Multiple Vitamin (MULTI-VITAMINS) TABS Take by mouth.   topiramate (TOPAMAX) 25 MG tablet Take 1 tablet (25 mg total) by mouth 2 (two) times daily.   [DISCONTINUED] rifaximin (XIFAXAN) 550 MG TABS tablet Take 1 tablet (550 mg total) by mouth 3 (three) times daily. (Patient not taking: Reported on 05/20/2022)   No facility-administered encounter medications on file as of 05/20/2022.    Surgical History: Past Surgical History:  Procedure Laterality Date   CARDIAC CATHETERIZATION N/A 03/22/2015   Procedure: Right/Left Heart Cath and Coronary Angiography;  Surgeon: Lamar Blinks, MD;  Location: [facility]  INVASIVE CV LAB;  Service: Cardiovascular;  Laterality: N/A;   COLONOSCOPY WITH PROPOFOL N/A 11/27/2018   Procedure: COLONOSCOPY WITH PROPOFOL;  Surgeon: Pasty Spillers, MD;  Location: [facility] ENDOSCOPY;  Service: Endoscopy;  Laterality: N/A;   MITRAL VALVE REPAIR     TEE WITHOUT CARDIOVERSION N/A 03/22/2015   Procedure: TRANSESOPHAGEAL ECHOCARDIOGRAM (TEE);  Surgeon: Lamar Blinks, MD;  Location: [facility] ORS;  Service: Cardiovascular;  Laterality: N/A;    Medical History: Past Medical History:  Diagnosis Date   Heart murmur    Mitral valve disorder     Family History: Family History  Family history unknown: Yes      Review of Systems  Constitutional:  Negative for chills, fatigue and unexpected weight change.  HENT:  Negative for congestion, postnasal drip, rhinorrhea, sneezing and sore throat.   Eyes:  Positive for visual disturbance. Negative for redness.  Respiratory:  Negative for cough, chest tightness and shortness of breath.   Cardiovascular:  Negative for chest pain and palpitations.  Gastrointestinal:  Negative for abdominal pain, constipation, diarrhea, nausea and vomiting.  Genitourinary:  Negative for dysuria and frequency.  Musculoskeletal:  Negative for arthralgias, back pain, joint swelling and neck pain.  Skin:  Negative for rash.  Neurological: Negative.  Negative for tremors and numbness.  Hematological:  Negative for adenopathy. Does not bruise/bleed easily.  Psychiatric/Behavioral:  Negative for behavioral problems (Depression), sleep disturbance and suicidal ideas. The patient is not nervous/anxious.      Vital Signs: BP 130/80   Pulse 81   Temp 97.8 F (36.6 C)   Resp 16   Ht 5' 9.5" (  1.765 m)   Wt 216 lb (98 kg)   SpO2 97%   BMI 31.44 kg/m    Physical Exam Vitals reviewed.  Constitutional:      General: He is not in acute distress.    Appearance: Normal appearance. He is obese. He is not ill-appearing.  HENT:     Head: Normocephalic  and atraumatic.  Eyes:     Extraocular Movements: Extraocular movements intact.     Pupils: Pupils are equal, round, and reactive to light.  Cardiovascular:     Rate and Rhythm: Normal rate and regular rhythm.  Pulmonary:     Effort: Pulmonary effort is normal. No respiratory distress.  Abdominal:     General: Bowel sounds are normal.     Palpations: Abdomen is soft.     Tenderness: There is no abdominal tenderness.  Musculoskeletal:        General: Normal range of motion.     Cervical back: Normal range of motion.  Skin:    General: Skin is warm and dry.  Neurological:     Mental Status: He is alert and oriented to person, place, and time.  Psychiatric:        Mood and Affect: Mood normal.        Behavior: Behavior normal.      LABS: Recent Results (from the past 2160 hour(s))  IgE Food w/Component Reflex II     Status: None   Collection Time: 03/19/22  3:49 PM  Result Value Ref Range   Class Description Allergens Comment     Comment:     Levels of Specific IgE       Class  Description of Class     ---------------------------  -----  --------------------                    < 0.10         0         Negative            0.10 -    0.31         0/I       Equivocal/Low            0.32 -    0.55         I         Low            0.56 -    1.40         II        Moderate            1.41 -    3.90         III       High            3.91 -   19.00         IV        Very High           19.01 -  100.00         V         Very High                   >100.00         VI        Very High    Clam IgE <0.10 Class 0 kU/L   Codfish IgE <0.10 Class 0 kU/L   Allergen Corn, IgE <0.10 Class  0 kU/L   Scallop IgE <0.10 Class 0 kU/L   Sesame Seed IgE <0.10 Class 0 kU/L   Shrimp IgE <0.10 Class 0 kU/L   Soybean IgE <0.10 Class 0 kU/L   Wheat IgE <0.10 Class 0 kU/L   F002-IgE Milk <0.10 Class 0 kU/L   F001-IgE Egg White <0.10 Class 0 kU/L   Peanut, IgE <0.10 Class 0 kU/L   F017-IgE Hazelnut  (Fil[place]) <0.10 Class 0 kU/L   F256-IgE Walnut <0.10 Class 0 kU/L   F202-IgE Cashew Nut <0.10 Class 0 kU/L   F018-IgE [place] Nut <0.10 Class 0 kU/L   Macadamia Nut, IgE <0.10 Class 0 kU/L   Pecan Nut IgE <0.10 Class 0 kU/L   F203-IgE Pistachio Nut <0.10 Class 0 kU/L   F020-IgE Almond <0.10 Class 0 kU/L    .MAMM    Assessment/Plan: 1. Encounter for general adult medical examination with abnormal findings CPE performed, routine fasting labs ordered  2. 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 May need to titrate up. If not improving may consider follow up with neurology - topiramate (TOPAMAX) 25 MG tablet; Take 1 tablet (25 mg total) by mouth 2 (two) times daily.  Dispense: 60 tablet; Refill: 0  3. B12 deficiency - B12 and Folate Panel  4. Hypercholesteremia - Lipid Panel With LDL/HDL Ratio  5. Other fatigue - B12 and Folate Panel - Comprehensive metabolic panel - CBC w/Diff/Platelet - TSH + free T4 - Lipid Panel With LDL/HDL Ratio - PSA Total (Reflex To Free)  6. Screening for prostate cancer - PSA Total (Reflex To Free)   General Counseling: Tomaz verbalizes understanding of the findings of todays visit and agrees with plan of treatment. I have discussed any further diagnostic evaluation that may be needed or ordered today. We also reviewed his medications today. he has been encouraged to call the office with any questions or concerns that should arise related to todays visit.    Counseling:    Orders Placed This Encounter  Procedures   B12 and Folate Panel   Comprehensive metabolic panel   CBC w/Diff/Platelet   TSH + free T4   Lipid Panel With LDL/HDL Ratio   PSA Total (Reflex To Free)    Meds ordered this encounter  Medications   topiramate (TOPAMAX) 25 MG tablet    Sig: Take 1 tablet (25 mg total) by mouth 2 (two) times daily.    Dispense:  60 tablet    Refill:  0    This patient was seen by Lynn Ito, PA-C in collaboration with Dr. Beverely Risen as a part of collaborative care agreement.  Total time spent:35 Minutes  Time spent includes review of chart, medications, test results, and follow up plan with the patient.     Lyndon Code, MD  Internal Medicine

## 2022-05-20 NOTE — Addendum Note (Signed)
Addended by: Annamaria Helling on: 05/20/2022 04:08 PM   Modules accepted: Orders

## 2022-05-21 LAB — UA/M W/RFLX CULTURE, ROUTINE
Bilirubin, UA: NEGATIVE
Glucose, UA: NEGATIVE
Leukocytes,UA: NEGATIVE
Nitrite, UA: NEGATIVE
Protein,UA: NEGATIVE
RBC, UA: NEGATIVE
Specific Gravity, UA: 1.024 (ref 1.005–1.030)
Urobilinogen, Ur: 0.2 mg/dL (ref 0.2–1.0)
pH, UA: 6 (ref 5.0–7.5)

## 2022-05-21 LAB — MICROSCOPIC EXAMINATION
Bacteria, UA: NONE SEEN
Casts: NONE SEEN /lpf
Epithelial Cells (non renal): NONE SEEN /hpf (ref 0–10)
WBC, UA: NONE SEEN /hpf (ref 0–5)

## 2022-06-17 ENCOUNTER — Ambulatory Visit: Payer: BC Managed Care – PPO | Admitting: Physician Assistant

## 2022-06-20 ENCOUNTER — Other Ambulatory Visit: Payer: Self-pay

## 2022-06-20 ENCOUNTER — Encounter: Payer: Self-pay | Admitting: Gastroenterology

## 2022-06-20 ENCOUNTER — Ambulatory Visit (INDEPENDENT_AMBULATORY_CARE_PROVIDER_SITE_OTHER): Payer: BC Managed Care – PPO | Admitting: Gastroenterology

## 2022-06-20 VITALS — BP 119/81 | HR 82 | Temp 98.3°F | Ht 69.0 in | Wt 217.6 lb

## 2022-06-20 DIAGNOSIS — R140 Abdominal distension (gaseous): Secondary | ICD-10-CM | POA: Diagnosis not present

## 2022-06-20 LAB — CBC WITH DIFFERENTIAL/PLATELET
Basophils Absolute: 0 10*3/uL (ref 0.0–0.2)
Basos: 1 %
EOS (ABSOLUTE): 0.1 10*3/uL (ref 0.0–0.4)
Eos: 3 %
Hematocrit: 48.1 % (ref 37.5–51.0)
Hemoglobin: 16.4 g/dL (ref 13.0–17.7)
Immature Grans (Abs): 0 10*3/uL (ref 0.0–0.1)
Immature Granulocytes: 1 %
Lymphocytes Absolute: 1.6 10*3/uL (ref 0.7–3.1)
Lymphs: 37 %
MCH: 30.1 pg (ref 26.6–33.0)
MCHC: 34.1 g/dL (ref 31.5–35.7)
MCV: 88 fL (ref 79–97)
Monocytes Absolute: 0.5 10*3/uL (ref 0.1–0.9)
Monocytes: 11 %
Neutrophils Absolute: 2.1 10*3/uL (ref 1.4–7.0)
Neutrophils: 47 %
Platelets: 153 10*3/uL (ref 150–450)
RBC: 5.44 x10E6/uL (ref 4.14–5.80)
RDW: 13.3 % (ref 11.6–15.4)
WBC: 4.4 10*3/uL (ref 3.4–10.8)

## 2022-06-20 LAB — COMPREHENSIVE METABOLIC PANEL
ALT: 14 IU/L (ref 0–44)
AST: 26 IU/L (ref 0–40)
Albumin/Globulin Ratio: 1.6 (ref 1.2–2.2)
Albumin: 4.2 g/dL (ref 3.8–4.9)
Alkaline Phosphatase: 49 IU/L (ref 44–121)
BUN/Creatinine Ratio: 8 — ABNORMAL LOW (ref 9–20)
BUN: 12 mg/dL (ref 6–24)
Bilirubin Total: 0.8 mg/dL (ref 0.0–1.2)
CO2: 23 mmol/L (ref 20–29)
Calcium: 8.8 mg/dL (ref 8.7–10.2)
Chloride: 101 mmol/L (ref 96–106)
Creatinine, Ser: 1.43 mg/dL — ABNORMAL HIGH (ref 0.76–1.27)
Globulin, Total: 2.7 g/dL (ref 1.5–4.5)
Glucose: 83 mg/dL (ref 70–99)
Potassium: 4.4 mmol/L (ref 3.5–5.2)
Sodium: 138 mmol/L (ref 134–144)
Total Protein: 6.9 g/dL (ref 6.0–8.5)
eGFR: 59 mL/min/{1.73_m2} — ABNORMAL LOW (ref >59)

## 2022-06-20 LAB — PSA TOTAL (REFLEX TO FREE): Prostate Specific Ag, Serum: 0.7 ng/mL (ref 0.0–4.0)

## 2022-06-20 LAB — B12 AND FOLATE PANEL
Folate: 13.6 ng/mL (ref >3.0)
Vitamin B-12: 574 pg/mL (ref 232–1245)

## 2022-06-20 LAB — LIPID PANEL WITH LDL/HDL RATIO
Cholesterol, Total: 186 mg/dL (ref 100–199)
HDL: 58 mg/dL (ref >39)
LDL Chol Calc (NIH): 114 mg/dL — ABNORMAL HIGH (ref 0–99)
LDL/HDL Ratio: 2 ratio (ref 0.0–3.6)
Triglycerides: 73 mg/dL (ref 0–149)
VLDL Cholesterol Cal: 14 mg/dL (ref 5–40)

## 2022-06-20 LAB — TSH+FREE T4
Free T4: 1.22 ng/dL (ref 0.82–1.77)
TSH: 1.6 u[IU]/mL (ref 0.450–4.500)

## 2022-06-20 NOTE — Progress Notes (Signed)
Wyline Mood MD, MRCP(U.K) [address]  [address]  [place], [place] [address]  Main: [phone]  Fax: [phone]   Gastroenterology Consultation  Referring Provider:     Lyndon Code, MD Primary Care Physician:  Carlean Jews, PA-C Primary Gastroenterologist:  Dr. Wyline Mood  Reason for Consultation: abdominal pain         HPI:   Ricky Ross is a 52 y.o. y/o male referred for consultation & management  by Carlean Jews, PA-C.   He states he had to see me for abdominal bloating and gas which has been going on for a few years.  He states that it begins a few hours after having a meal.  His food is rich in processed items chocolate cookies and items with high fructose corn syrup.  Denies any constipation.  No other GI symptoms.  He was previously been seen by Dr Maximino Greenland back in 2021 for abdominal bloating .  11/27/2018: Colonoscopy: 4 mm polyp in the ascending colon .Adenoma .  02/17/2019: Ct abdomen : no acute findings.  06/19/2022: Hb 16.4 grams , cr 1.43    Past Medical History:  Diagnosis Date   Heart murmur    Mitral valve disorder     Past Surgical History:  Procedure Laterality Date   CARDIAC CATHETERIZATION N/A 03/22/2015   Procedure: Right/Left Heart Cath and Coronary Angiography;  Surgeon: Lamar Blinks, MD;  Location: [facility] INVASIVE CV LAB;  Service: Cardiovascular;  Laterality: N/A;   COLONOSCOPY WITH PROPOFOL N/A 11/27/2018   Procedure: COLONOSCOPY WITH PROPOFOL;  Surgeon: Pasty Spillers, MD;  Location: [facility] ENDOSCOPY;  Service: Endoscopy;  Laterality: N/A;   MITRAL VALVE REPAIR     TEE WITHOUT CARDIOVERSION N/A 03/22/2015   Procedure: TRANSESOPHAGEAL ECHOCARDIOGRAM (TEE);  Surgeon: Lamar Blinks, MD;  Location: [facility] ORS;  Service: Cardiovascular;  Laterality: N/A;    Prior to Admission medications   Medication Sig Start Date End Date Taking? Authorizing Provider  Multiple Vitamin (MULTI-VITAMINS) TABS Take by mouth.     [provider]  topiramate (TOPAMAX) 25 MG tablet Take 1 tablet (25 mg total) by mouth 2 (two) times daily. 05/20/22   McDonough, Salomon Fick, PA-C    Family History  Family history unknown: Yes     Social History   Tobacco Use   Smoking status: Never   Smokeless tobacco: Never  Vaping Use   Vaping Use: Never used  Substance Use Topics   Alcohol use: No   Drug use: No    Allergies as of 06/20/2022 - Review Complete 06/20/2022  Allergen Reaction Noted   Milk (cow) Hives 11/18/2014   Milk-related compounds Hives 03/22/2015    Review of Systems:    All systems reviewed and negative except where noted in HPI.   Physical Exam:  BP 119/81   Pulse 82   Temp 98.3 F (36.8 C) (Oral)   Ht 5\' 9"  (1.753 m)   Wt 217 lb 9.6 oz (98.7 kg)   BMI 32.13 kg/m  No LMP for male patient. Psych:  Alert and cooperative. Normal mood and affect. General:   Alert,  Well-developed, well-nourished, pleasant and cooperative in NAD Head:  Normocephalic and atraumatic. Eyes:  Sclera clear, no icterus.   Conjunctiva pink. Ears:  Normal auditory acuity. Neck:  Supple; no masses or thyromegaly. Abdomen:  Normal bowel sounds.  No bruits.  Soft, non-tender and non-distended without masses, hepatosplenomegaly or hernias noted.  No guarding or rebound tenderness.  Neurologic:  Alert and oriented x3;  grossly normal neurologically. Psych:  Alert and cooperative. Normal mood and affect.  Imaging Studies: No results found.  Assessment and Plan:   Ricky Ross is a 52 y.o. y/o male has been referred for abdominal pain .  He denies any abdominal pain his main symptoms are  gas and bloating.  I went through the differential diagnosis with him including celiac disease, bacterial overgrowth syndrome including IBS diarrhea.  Plan 1.  Trial of low FODMAP diet as needed use of charcoal 2.  Celiac serology 3.  If the above does not help we will consider a course of Xifaxan for IBS diarrhea 4.   Advised to stop consuming all artificial sugars weakness processed foods for 2 weeks to see if it helps him with his symptoms  Follow up in as needed  Dr Wyline Mood MD,MRCP(U.K)

## 2022-06-21 LAB — CELIAC DISEASE AB SCREEN W/RFX: IgA/Immunoglobulin A, Serum: 128 mg/dL (ref 90–386)

## 2022-06-24 LAB — CELIAC DISEASE AB SCREEN W/RFX
Antigliadin Abs, IgA: 3 units (ref 0–19)
Transglutaminase IgA: <2 U/mL (ref 0–3)

## 2022-07-08 ENCOUNTER — Ambulatory Visit (INDEPENDENT_AMBULATORY_CARE_PROVIDER_SITE_OTHER): Payer: BC Managed Care – PPO | Admitting: Physician Assistant

## 2022-07-08 ENCOUNTER — Encounter: Payer: Self-pay | Admitting: Physician Assistant

## 2022-07-08 VITALS — BP 130/88 | HR 68 | Temp 98.7°F | Resp 16 | Ht 69.0 in | Wt 219.0 lb

## 2022-07-08 DIAGNOSIS — E7800 Pure hypercholesterolemia, unspecified: Secondary | ICD-10-CM | POA: Diagnosis not present

## 2022-07-08 DIAGNOSIS — R140 Abdominal distension (gaseous): Secondary | ICD-10-CM | POA: Diagnosis not present

## 2022-07-08 DIAGNOSIS — N1831 Chronic kidney disease, stage 3a: Secondary | ICD-10-CM

## 2022-07-08 NOTE — Progress Notes (Signed)
[facility] [address] [place], [place] [address]  Internal MEDICINE  Office Visit Note  Patient Name: Ricky Ross  [id number]  [id number]  Date of Service: 07/17/2022  Chief Complaint  Patient presents with   Follow-up    Review labs     HPI Pt is here for routine follow up -Labs show a little elevated cholesterol will restart fish oil. His renal function remains abnormal but has been stable and slightly improved--continue to monitor -Seeing GI now, trying low FODMAP diet, and charcoal tabs if needed. Seems to be improving. He will follow up as needed -Will check about shingles vaccines -admits he never tried topiramate, hasn't had a problem recently with any double vision.  Current Medication: Outpatient Encounter Medications as of 07/08/2022  Medication Sig   Multiple Vitamin (MULTI-VITAMINS) TABS Take by mouth.   No facility-administered encounter medications on file as of 07/08/2022.    Surgical History: Past Surgical History:  Procedure Laterality Date   CARDIAC CATHETERIZATION N/A 03/22/2015   Procedure: Right/Left Heart Cath and Coronary Angiography;  Surgeon: Lamar Blinks, MD;  Location: [facility] INVASIVE CV LAB;  Service: Cardiovascular;  Laterality: N/A;   COLONOSCOPY WITH PROPOFOL N/A 11/27/2018   Procedure: COLONOSCOPY WITH PROPOFOL;  Surgeon: Pasty Spillers, MD;  Location: [facility] ENDOSCOPY;  Service: Endoscopy;  Laterality: N/A;   MITRAL VALVE REPAIR     TEE WITHOUT CARDIOVERSION N/A 03/22/2015   Procedure: TRANSESOPHAGEAL ECHOCARDIOGRAM (TEE);  Surgeon: Lamar Blinks, MD;  Location: [facility] ORS;  Service: Cardiovascular;  Laterality: N/A;    Medical History: Past Medical History:  Diagnosis Date   Heart murmur    Mitral valve disorder     Family History: Family History  Family history unknown: Yes    Social History   Socioeconomic History   Marital status: Married    Spouse name: Not on file   Number of children: Not on file    Years of education: Not on file   Highest education level: Not on file  Occupational History   Not on file  Tobacco Use   Smoking status: Never   Smokeless tobacco: Never  Vaping Use   Vaping Use: Never used  Substance and Sexual Activity   Alcohol use: No   Drug use: No   Sexual activity: Not on file  Other Topics Concern   Not on file  Social History Narrative   Not on file   Social Determinants of Health   Financial Resource Strain: Not on file  Food Insecurity: Not on file  Transportation Needs: Not on file  Physical Activity: Not on file  Stress: Not on file  Social Connections: Not on file  Intimate Partner Violence: Not on file      Review of Systems  Constitutional:  Negative for chills, fatigue and unexpected weight change.  HENT:  Negative for congestion, postnasal drip, rhinorrhea, sneezing and sore throat.   Eyes:  Negative for redness.  Respiratory:  Negative for cough, chest tightness and shortness of breath.   Cardiovascular:  Negative for chest pain and palpitations.  Gastrointestinal:  Negative for abdominal pain, constipation, diarrhea, nausea and vomiting.  Genitourinary:  Negative for dysuria and frequency.  Musculoskeletal:  Negative for arthralgias, back pain, joint swelling and neck pain.  Skin:  Negative for rash.  Neurological: Negative.  Negative for tremors and numbness.  Hematological:  Negative for adenopathy. Does not bruise/bleed easily.  Psychiatric/Behavioral:  Negative for behavioral problems (Depression), sleep disturbance and suicidal ideas. The patient  is not nervous/anxious.     Vital Signs: BP 130/88   Pulse 68   Temp 98.7 F (37.1 C)   Resp 16   Ht 5\' 9"  (1.753 m)   Wt 219 lb (99.3 kg)   SpO2 98%   BMI 32.34 kg/m    Physical Exam Vitals reviewed.  Constitutional:      General: He is not in acute distress.    Appearance: Normal appearance. He is obese. He is not ill-appearing.  HENT:     Head: Normocephalic and  atraumatic.  Eyes:     Extraocular Movements: Extraocular movements intact.     Pupils: Pupils are equal, round, and reactive to light.  Cardiovascular:     Rate and Rhythm: Normal rate and regular rhythm.  Pulmonary:     Effort: Pulmonary effort is normal. No respiratory distress.  Abdominal:     General: Bowel sounds are normal.     Palpations: Abdomen is soft.  Musculoskeletal:        General: Normal range of motion.     Cervical back: Normal range of motion.  Skin:    General: Skin is warm and dry.  Neurological:     Mental Status: He is alert and oriented to person, place, and time.  Psychiatric:        Mood and Affect: Mood normal.        Behavior: Behavior normal.        Assessment/Plan: 1. Hypercholesteremia Will work on diet and exercise and take fish oil supplement  2. Stage 3a chronic kidney disease (HCC) Stable, continue to monitor  3. Abdominal bloating Followed by GI, following low FODMAP   General Counseling: Keandre verbalizes understanding of the findings of todays visit and agrees with plan of treatment. I have discussed any further diagnostic evaluation that may be needed or ordered today. We also reviewed his medications today. he has been encouraged to call the office with any questions or concerns that should arise related to todays visit.    No orders of the defined types were placed in this encounter.   No orders of the defined types were placed in this encounter.   This patient was seen by Lynn Ito, PA-C in collaboration with Dr. Beverely Risen as a part of collaborative care agreement.   Total time spent:30 Minutes Time spent includes review of chart, medications, test results, and follow up plan with the patient.      Dr Lyndon Code Internal medicine

## 2022-12-12 ENCOUNTER — Other Ambulatory Visit: Payer: Self-pay | Admitting: Physician Assistant

## 2022-12-12 ENCOUNTER — Telehealth: Payer: Self-pay

## 2022-12-12 DIAGNOSIS — Z131 Encounter for screening for diabetes mellitus: Secondary | ICD-10-CM

## 2022-12-12 NOTE — Telephone Encounter (Signed)
Left message for patient to give office a call back

## 2022-12-13 NOTE — Telephone Encounter (Signed)
Patient notified

## 2022-12-20 ENCOUNTER — Ambulatory Visit: Payer: BC Managed Care – PPO | Admitting: Physician Assistant

## 2023-01-09 ENCOUNTER — Ambulatory Visit: Payer: BC Managed Care – PPO | Admitting: Physician Assistant

## 2023-02-27 ENCOUNTER — Ambulatory Visit (INDEPENDENT_AMBULATORY_CARE_PROVIDER_SITE_OTHER): Payer: 59 | Admitting: Physician Assistant

## 2023-02-27 ENCOUNTER — Encounter: Payer: Self-pay | Admitting: Physician Assistant

## 2023-02-27 VITALS — BP 112/87 | HR 82 | Temp 98.2°F | Resp 16 | Ht 69.0 in | Wt 220.0 lb

## 2023-02-27 DIAGNOSIS — R5383 Other fatigue: Secondary | ICD-10-CM

## 2023-02-27 DIAGNOSIS — R39198 Other difficulties with micturition: Secondary | ICD-10-CM

## 2023-02-27 DIAGNOSIS — Z125 Encounter for screening for malignant neoplasm of prostate: Secondary | ICD-10-CM | POA: Diagnosis not present

## 2023-02-27 DIAGNOSIS — Z87898 Personal history of other specified conditions: Secondary | ICD-10-CM

## 2023-02-27 DIAGNOSIS — N1831 Chronic kidney disease, stage 3a: Secondary | ICD-10-CM

## 2023-02-27 DIAGNOSIS — Z1329 Encounter for screening for other suspected endocrine disorder: Secondary | ICD-10-CM

## 2023-02-27 DIAGNOSIS — Z131 Encounter for screening for diabetes mellitus: Secondary | ICD-10-CM | POA: Diagnosis not present

## 2023-02-27 DIAGNOSIS — E7800 Pure hypercholesterolemia, unspecified: Secondary | ICD-10-CM

## 2023-02-27 NOTE — Progress Notes (Signed)
[facility] [address] [place], [place] [address]  Internal MEDICINE  Office Visit Note  Patient Name: Ricky Ross  [id number]  [id number]  Date of Service: 03/09/2023  Chief Complaint  Patient presents with   Acute Visit   Prostate Check    Patient has concerns about prostate, having trouble with urine frequency     HPI Pt is here for a sick visit. -Some trouble initiating stream, especially after holding for awhile -Nocturia previously, but not currently now that he is limiting fluids at night -No burning with urination -has been ongoing and was previously referred to urology, but did not go and would like new referral now -discussed likely BPH, but will refer for further evaluation. Also due for labs and will check PSA with it  Current Medication:  Outpatient Encounter Medications as of 02/27/2023  Medication Sig   Multiple Vitamin (MULTI-VITAMINS) TABS Take by mouth.   No facility-administered encounter medications on file as of 02/27/2023.      Medical History: Past Medical History:  Diagnosis Date   Heart murmur    Mitral valve disorder      Vital Signs: BP 112/87   Pulse 82   Temp 98.2 F (36.8 C)   Resp 16   Ht 5\' 9"  (1.753 m)   Wt 220 lb (99.8 kg)   SpO2 97%   BMI 32.49 kg/m    Review of Systems  Constitutional:  Negative for fatigue and fever.  HENT:  Negative for congestion, mouth sores and postnasal drip.   Respiratory:  Negative for cough.   Cardiovascular:  Negative for chest pain.  Genitourinary:  Positive for difficulty urinating and frequency. Negative for flank pain and hematuria.  Psychiatric/Behavioral: Negative.      Physical Exam Vitals and nursing note reviewed.  Constitutional:      Appearance: Normal appearance.  HENT:     Head: Normocephalic and atraumatic.  Eyes:     Extraocular Movements: Extraocular movements intact.  Cardiovascular:     Rate and Rhythm: Normal rate and regular rhythm.  Pulmonary:      Effort: Pulmonary effort is normal.     Breath sounds: Normal breath sounds.  Neurological:     Mental Status: He is alert.  Psychiatric:        Mood and Affect: Mood normal.        Behavior: Behavior normal.       Assessment/Plan: 1. Difficulty urinating (Primary) Will refer to urology for further evaluation - Ambulatory referral to Urology  2. Decreased urine stream - Ambulatory referral to Urology  3. History of nocturia Improving since limiting fluids at night - Ambulatory referral to Urology  4. Diabetes mellitus screening - Hgb A1C w/o eAG  5. Screening for prostate cancer - PSA Total (Reflex To Free)  6. Hypercholesteremia - Lipid Panel With LDL/HDL Ratio  7. Stage 3a chronic kidney disease (HCC) - Comprehensive metabolic panel  8. Thyroid disorder screen - TSH + free T4  9. Other fatigue - CBC w/Diff/Platelet - Comprehensive metabolic panel - TSH + free T4 - Lipid Panel With LDL/HDL Ratio - PSA Total (Reflex To Free) - Hgb A1C w/o eAG   General Counseling: [name] verbalizes understanding of the findings of todays visit and agrees with plan of treatment. I have discussed any further diagnostic evaluation that may be needed or ordered today. We also reviewed his medications today. he has been encouraged to call the office with any questions or concerns that should arise related  to todays visit.    Counseling:    Orders Placed This Encounter  Procedures   CBC w/Diff/Platelet   Comprehensive metabolic panel   TSH + free T4   Lipid Panel With LDL/HDL Ratio   PSA Total (Reflex To Free)   Hgb A1C w/o eAG   Ambulatory referral to Urology    No orders of the defined types were placed in this encounter.   Time spent:30 Minutes

## 2023-03-11 LAB — TSH+FREE T4
Free T4: 1.26 ng/dL (ref 0.82–1.77)
TSH: 1.65 u[IU]/mL (ref 0.450–4.500)

## 2023-03-11 LAB — COMPREHENSIVE METABOLIC PANEL
ALT: 20 [IU]/L (ref 0–44)
AST: 39 [IU]/L (ref 0–40)
Albumin: 4.3 g/dL (ref 3.8–4.9)
Alkaline Phosphatase: 55 [IU]/L (ref 44–121)
BUN/Creatinine Ratio: 10 (ref 9–20)
BUN: 15 mg/dL (ref 6–24)
Bilirubin Total: 0.4 mg/dL (ref 0.0–1.2)
CO2: 21 mmol/L (ref 20–29)
Calcium: 9 mg/dL (ref 8.7–10.2)
Chloride: 101 mmol/L (ref 96–106)
Creatinine, Ser: 1.54 mg/dL — ABNORMAL HIGH (ref 0.76–1.27)
Globulin, Total: 2.8 g/dL (ref 1.5–4.5)
Glucose: 80 mg/dL (ref 70–99)
Sodium: 138 mmol/L (ref 134–144)
Total Protein: 7.1 g/dL (ref 6.0–8.5)
eGFR: 53 mL/min/{1.73_m2} — ABNORMAL LOW (ref >59)

## 2023-03-11 LAB — CBC WITH DIFFERENTIAL/PLATELET
Basophils Absolute: 0 10*3/uL (ref 0.0–0.2)
Basos: 1 %
EOS (ABSOLUTE): 0 10*3/uL (ref 0.0–0.4)
Eos: 1 %
Hematocrit: 49.9 % (ref 37.5–51.0)
Hemoglobin: 16.3 g/dL (ref 13.0–17.7)
Immature Grans (Abs): 0 10*3/uL (ref 0.0–0.1)
Immature Granulocytes: 1 %
Lymphocytes Absolute: 1.6 10*3/uL (ref 0.7–3.1)
Lymphs: 40 %
MCH: 29.9 pg (ref 26.6–33.0)
MCHC: 32.7 g/dL (ref 31.5–35.7)
MCV: 92 fL (ref 79–97)
Monocytes Absolute: 0.4 10*3/uL (ref 0.1–0.9)
Monocytes: 11 %
Neutrophils Absolute: 1.9 10*3/uL (ref 1.4–7.0)
Neutrophils: 46 %
Platelets: 179 10*3/uL (ref 150–450)
RBC: 5.45 x10E6/uL (ref 4.14–5.80)
RDW: 13.4 % (ref 11.6–15.4)
WBC: 4 10*3/uL (ref 3.4–10.8)

## 2023-03-11 LAB — LIPID PANEL WITH LDL/HDL RATIO
Cholesterol, Total: 191 mg/dL (ref 100–199)
HDL: 54 mg/dL (ref >39)
LDL Chol Calc (NIH): 124 mg/dL — ABNORMAL HIGH (ref 0–99)
LDL/HDL Ratio: 2.3 {ratio} (ref 0.0–3.6)
Triglycerides: 69 mg/dL (ref 0–149)
VLDL Cholesterol Cal: 13 mg/dL (ref 5–40)

## 2023-03-11 LAB — PSA TOTAL (REFLEX TO FREE): Prostate Specific Ag, Serum: 1.2 ng/mL (ref 0.0–4.0)

## 2023-03-11 LAB — HGB A1C W/O EAG: Hgb A1c MFr Bld: 5.9 % — ABNORMAL HIGH (ref 4.8–5.6)

## 2023-03-13 ENCOUNTER — Telehealth: Payer: Self-pay

## 2023-03-13 NOTE — Telephone Encounter (Signed)
-----   Message from Carlean Jews sent at 03/13/2023 12:49 PM EST ----- Renal function slightly reduced from previous. Pt is seeing urology soon and can wait and see if they plan on doing any imaging, if not then we can consider updating renal imaging. LDL increased from previous and A1c at 5.9 which is in prediabetic range and pt should work on diet and exercise to help both cholesterol and sugar. Could consider fish oil supplement as well.

## 2023-03-13 NOTE — Telephone Encounter (Signed)
Pt advised for labs result see other note from lauren

## 2023-03-13 NOTE — Telephone Encounter (Signed)
Pt was notified for labs result

## 2023-03-28 ENCOUNTER — Other Ambulatory Visit: Payer: Self-pay

## 2023-03-28 DIAGNOSIS — R350 Frequency of micturition: Secondary | ICD-10-CM

## 2023-03-28 DIAGNOSIS — R351 Nocturia: Secondary | ICD-10-CM

## 2023-04-02 ENCOUNTER — Ambulatory Visit: Payer: Self-pay | Admitting: Urology

## 2023-04-02 ENCOUNTER — Encounter: Payer: Self-pay | Admitting: Urology

## 2023-04-02 ENCOUNTER — Other Ambulatory Visit
Admission: RE | Admit: 2023-04-02 | Discharge: 2023-04-02 | Disposition: A | Discharge status: Home / Self Care | Attending: Urology | Admitting: Urology

## 2023-04-02 VITALS — BP 134/90 | Ht 69.0 in | Wt 220.0 lb

## 2023-04-02 DIAGNOSIS — R350 Frequency of micturition: Secondary | ICD-10-CM | POA: Diagnosis present

## 2023-04-02 DIAGNOSIS — Z125 Encounter for screening for malignant neoplasm of prostate: Secondary | ICD-10-CM

## 2023-04-02 DIAGNOSIS — R351 Nocturia: Secondary | ICD-10-CM | POA: Insufficient documentation

## 2023-04-02 DIAGNOSIS — R399 Unspecified symptoms and signs involving the genitourinary system: Secondary | ICD-10-CM

## 2023-04-02 LAB — URINALYSIS, COMPLETE (UACMP) WITH MICROSCOPIC
Bacteria, UA: NONE SEEN
Bilirubin Urine: NEGATIVE
Glucose, UA: NEGATIVE mg/dL
Hgb urine dipstick: NEGATIVE
Ketones, ur: NEGATIVE mg/dL
Leukocytes,Ua: NEGATIVE
Nitrite: NEGATIVE
Protein, ur: NEGATIVE mg/dL
RBC / HPF: NONE SEEN RBC/hpf (ref 0–5)
Specific Gravity, Urine: <1.005 — ABNORMAL LOW (ref 1.005–1.030)
Squamous Epithelial / HPF: NONE SEEN /HPF (ref 0–5)
WBC, UA: NONE SEEN WBC/hpf (ref 0–5)
pH: 6.5 (ref 5.0–8.0)

## 2023-04-02 LAB — BLADDER SCAN AMB NON-IMAGING: Scan Result: 99

## 2023-04-02 MED ORDER — TAMSULOSIN HCL 0.4 MG PO CAPS: 0.4000 mg | ORAL_CAPSULE | Freq: Every day | ORAL | 11 refills | Status: AC

## 2023-04-02 NOTE — Progress Notes (Signed)
   04/02/23 11:14 AM   Ricky Ross Willeen Cass Aug 26, 1970 [id number]  CC: Lower urinary symptoms, PSA screening  HPI: 53 year old male who reports a few months of worsening urinary symptoms with slow stream, nocturia up to 4 times per night, and urgency and frequency.  He has never tried any medications for this.  He denies any new medications or changes in his diet.  Urinalysis today is benign, PVR is normal at 99ml.  Hemoglobin A1c 5.9, recent PSA normal at 1.2 and stable over the last 5 years.  No prior pelvic imaging to review.  IPSS score today is 20.   PMH: Past Medical History:  Diagnosis Date   Heart murmur    Mitral valve disorder     Surgical History: Past Surgical History:  Procedure Laterality Date   CARDIAC CATHETERIZATION N/A 03/22/2015   Procedure: Right/Left Heart Cath and Coronary Angiography;  Surgeon: Lamar Blinks, MD;  Location: [facility]IVE [facility];  Service: Cardiovascular;  Laterality: N/A;   COLONOSCOPY WITH PROPOFOL N/A 11/27/2018   Procedure: COLONOSCOPY WITH PROPOFOL;  Surgeon: Pasty Spillers, MD;  Location: [facility] ENDOSCOPY;  Service: Endoscopy;  Laterality: N/A;   MITRAL VALVE REPAIR     TEE WITHOUT CARDIOVERSION N/A 03/22/2015   Procedure: TRANSESOPHAGEAL ECHOCARDIOGRAM (TEE);  Surgeon: Lamar Blinks, MD;  Location: [facility] ORS;  Service: Cardiovascular;  Laterality: N/A;     Social History:  reports that he has never smoked. He has never used smokeless tobacco. He reports that he does not drink alcohol and does not use drugs.  Physical Exam: BP (!) 134/90   Ht 5\' 9"  (1.753 m)   Wt 220 lb (99.8 kg)   BMI 32.49 kg/m    Constitutional:  Alert and oriented, No acute distress. Cardiovascular: No clubbing, cyanosis, or edema. Respiratory: Normal respiratory effort, no increased work of breathing. GI: Abdomen is soft, nontender, nondistended, no abdominal masses   Laboratory Data: Reviewed, see HPI   Assessment & Plan:   53 year old male with few  months of worsening urinary symptoms including slow stream, urgency, frequency, nocturia up to 4 times per night-urinalysis and PVR benign, PSA normal.  We discussed possible etiologies including BPH, OAB, urethral stricture, less likely infection.  We discussed options including further evaluation with cystoscopy/TRUS, or empiric trial of medication like Flomax.  He opted for trial of medication, and risk and benefits of Flomax were reviewed at length.  Trial of Flomax, RTC 4 to 6 weeks symptom check, consider cystoscopy/TRUS for further evaluation of persistent symptoms, or opts to pursue definitive treatments  Legrand Rams, MD 04/02/2023  [facility] [address], [address] [place], [place] [address] [phone]

## 2023-04-02 NOTE — Patient Instructions (Addendum)
 Benign Prostatic Hyperplasia  Benign prostatic hyperplasia (BPH) is an enlarged prostate gland that is caused by the normal aging process. The prostate may get bigger as a man gets older. The condition is not caused by cancer. The prostate is a walnut-sized gland that is involved in the production of semen. It is located in front of the rectum and below the bladder. The bladder stores urine. The urethra carries stored urine out of the body. An enlarged prostate can press on the urethra. This can make it harder to pass urine. The buildup of urine in the bladder can cause infection. Back pressure and infection may progress to bladder damage and kidney (renal) failure. What are the causes? This condition is part of the normal aging process. However, not all men develop problems from this condition. If the prostate enlarges away from the urethra, urine flow will not be blocked. If it enlarges toward the urethra and compresses it, there will be problems passing urine. What increases the risk? This condition is more likely to develop in men older than 50 years. What are the signs or symptoms? Symptoms of this condition include: Getting up often during the night to urinate. Needing to urinate frequently during the day. Difficulty starting urine flow. Decrease in size and strength of your urine stream. Leaking (dribbling) after urinating. Inability to pass urine. This needs immediate treatment. Inability to completely empty your bladder. Pain when you pass urine. This is more common if there is also an infection. Urinary tract infection (UTI). How is this diagnosed? This condition is diagnosed based on your medical history, a physical exam, and your symptoms. Tests will also be done, such as: A post-void bladder scan. This measures any amount of urine that may remain in your bladder after you finish urinating. A digital rectal exam. In a rectal exam, your health care provider checks your prostate by  putting a lubricated, gloved finger into your rectum to feel the back of your prostate gland. This exam detects the size of your gland and any abnormal lumps or growths. An exam of your urine (urinalysis). A prostate specific antigen (PSA) screening. This is a blood test used to screen for prostate cancer. An ultrasound. This test uses sound waves to electronically produce a picture of your prostate gland. Your health care provider may refer you to a specialist in kidney and prostate diseases (urologist). How is this treated? Once symptoms begin, your health care provider will monitor your condition (active surveillance or watchful waiting). Treatment for this condition will depend on the severity of your condition. Treatment may include: Observation and yearly exams. This may be the only treatment needed if your condition and symptoms are mild. Medicines to relieve your symptoms, including: Medicines to shrink the prostate. Medicines to relax the muscle of the prostate. Surgery in severe cases. Surgery may include: Prostatectomy. In this procedure, the prostate tissue is removed completely through an open incision or with a laparoscope or robotics. Transurethral resection of the prostate (TURP). In this procedure, a tool is inserted through the opening at the tip of the penis (urethra). It is used to cut away tissue of the inner core of the prostate. The pieces are removed through the same opening of the penis. This removes the blockage. Transurethral incision (TUIP). In this procedure, small cuts are made in the prostate. This lessens the prostate's pressure on the urethra. Transurethral microwave thermotherapy (TUMT). This procedure uses microwaves to create heat. The heat destroys and removes a small amount of  prostate tissue. Transurethral needle ablation (TUNA). This procedure uses radio frequencies to destroy and remove a small amount of prostate tissue. Interstitial laser coagulation (ILC).  This procedure uses a laser to destroy and remove a small amount of prostate tissue. Transurethral electrovaporization (TUVP). This procedure uses electrodes to destroy and remove a small amount of prostate tissue. Prostatic urethral lift. This procedure inserts an implant to push the lobes of the prostate away from the urethra. Follow these instructions at home: Take over-the-counter and prescription medicines only as told by your health care provider. Monitor your symptoms for any changes. Contact your health care provider with any changes. Avoid drinking large amounts of liquid before going to bed or out in public. Avoid or reduce how much caffeine or alcohol you drink. Give yourself time when you urinate. Keep all follow-up visits. This is important. Contact a health care provider if: You have unexplained back pain. Your symptoms do not get better with treatment. You develop side effects from the medicine you are taking. Your urine becomes very dark or has a bad smell. Your lower abdomen becomes distended and you have trouble passing urine. Get help right away if: You have a fever or chills. You suddenly cannot urinate. You feel light-headed or very dizzy, or you faint. There are large amounts of blood or clots in your urine. Your urinary problems become hard to manage. You develop moderate to severe low back or flank pain. The flank is the side of your body between the ribs and the hip. These symptoms may be an emergency. Get help right away. Call 911. Do not wait to see if the symptoms will go away. Do not drive yourself to the hospital. Summary Benign prostatic hyperplasia (BPH) is an enlarged prostate that is caused by the normal aging process. It is not caused by cancer. An enlarged prostate can press on the urethra. This can make it hard to pass urine. This condition is more likely to develop in men older than 50 years. Get help right away if you suddenly cannot urinate. This  information is not intended to replace advice given to you by your health care provider. Make sure you discuss any questions you have with your health care provider. Document Revised: 08/02/2020 Document Reviewed: 08/02/2020 Elsevier Patient Education  2024 [employer]  The Benefits of a Plant-Based Diet for Urology Health  A plant-based diet emphasizes the consumption of whole, unprocessed plant foods while minimizing or excluding animal products including meat and dairy products. This dietary approach has gained attention for its potential to promote overall health, including urology-related conditions. Incorporating a plant-based diet into your lifestyle can offer numerous benefits for maintaining optimal urology health.  1. Reduced Risk of Kidney Stones: A plant-based diet is typically rich in fruits, vegetables, legumes, and whole grains. These foods are high in dietary fiber, potassium, and magnesium, which can help reduce the risk of developing kidney stones. Be careful to avoid high quantities of spinach, as these can contribute to kidney stone formation if eaten in large volumes. The increased intake of water-soluble fiber can enhance the excretion of waste products and prevent the crystallization of minerals that lead to stone formation.  2. Improved Prostate Health: Studies have suggested a link between the consumption of red and processed meats and an increased risk of prostate problems, including benign prostatic hyperplasia (BPH) and prostate cancer. By adopting a plant-based diet, you can lower your intake of saturated fats and decrease the risk of these conditions. PSA  levels can often decrease on plant based diets! Plant foods are also rich in antioxidants and phytochemicals that have been associated with prostate health.  3. Better Bladder Function: A diet focused on plant-based foods can contribute to better bladder health by reducing the risk of urinary tract infections (UTIs).  Berries, citrus fruits, and leafy greens are known for their high vitamin C content, which can acidify urine and create an environment less favorable for bacteria growth. Additionally, plant-based diets are generally lower in sodium, which can help prevent fluid retention and reduce the strain on the bladder.  4. Management of Erectile Dysfunction (ED): Some research suggests that a plant-based diet can positively impact erectile function. Plant-based diets are associated with improved cardiovascular health, which is crucial for maintaining healthy blood flow and nerve function required for proper erectile function. By reducing the consumption of high-cholesterol and high-saturated fat animal products, a plant-based diet may contribute to a decreased risk of ED.  5. Prevention of Chronic Conditions: A plant-based diet can help prevent or manage chronic conditions such as obesity, diabetes, and hypertension. These conditions can contribute to urology-related issues, including urinary incontinence and kidney dysfunction. By maintaining a healthy weight and managing these conditions, you can reduce the risk of urology-related complications.  Conclusion: Embracing a plant-based diet can offer significant benefits for urology health. By incorporating a variety of colorful fruits, vegetables, whole grains, nuts, seeds, and legumes into your meals, you can support kidney health, prostate health, bladder function, and overall well-being. Remember to consult with a healthcare professional or registered dietitian before making any significant dietary changes, especially if you have existing health conditions. Your personalized approach to a plant-based diet can contribute to improved urology health and enhance your quality of life.      Nocturia refers to the need to wake up during the night to urinate, which can disrupt your sleep and impact your overall well-being. Fortunately, there are several strategies  you can employ to help prevent or manage nocturia. It's important to consult with your healthcare provider before making any significant changes to your routine. Here are some helpful strategies to consider:  Limit Fluid Intake Before Bed: Avoid drinking large amounts of fluids in the evening, especially within a few hours of bedtime. Consume most of your daily fluid intake earlier in the day to reduce the need to urinate at night.  Monitor Your Diet: Limit your intake of caffeine and alcohol, as these substances can increase urine production and irritate the bladder.  Avoid diet, "zero calorie," and artificially sweetened drinks, especially sodas, in the afternoon or evening. Be mindful of consuming foods and drinks with high water content before bedtime, such as watermelon and herbal teas.  Time Your Medications: If you're taking medications that contribute to increased urination, consult your healthcare provider about adjusting the timing of these medications to minimize their impact during the night.  Practice Double Voiding: Before going to bed, make an effort to empty your bladder twice within a short period. This can help reduce the amount of urine left in your bladder before sleep.  Bladder Training: Gradually increase the time between bathroom visits during the day to train your bladder to hold larger volumes of urine. Over time, this can help reduce the frequency of nighttime awakenings to urinate.  Elevate Your Legs During the Day: Elevating your legs during the day can help minimize fluid retention in your lower extremities, which might reduce nighttime urination.  Pelvic Floor Exercises: Strengthening your pelvic floor  muscles through Kegel exercises can help improve bladder control and potentially reduce the urge to urinate at night.  Create a Relaxing Bedtime Routine: Stress and anxiety can exacerbate nocturia. Engage in calming activities before bed, such as reading,  listening to soothing music, or practicing relaxation techniques.  Stay Active: Engage in regular physical activity, but avoid intense exercise close to bedtime, as this can increase your body's demand for fluids.  Maintain a Healthy Weight: Excess weight can compress the bladder and contribute to bladder and urinary issues. Aim to achieve and maintain a healthy weight through a balanced diet and regular exercise.  Remember that every individual is unique, and the effectiveness of these strategies may vary. It's important to work with your healthcare provider to develop a plan that suits your specific needs and addresses any underlying causes of nocturia.

## 2023-05-07 ENCOUNTER — Ambulatory Visit: Admitting: Urology

## 2023-05-07 VITALS — BP 132/85

## 2023-05-07 DIAGNOSIS — R351 Nocturia: Secondary | ICD-10-CM

## 2023-05-07 DIAGNOSIS — R350 Frequency of micturition: Secondary | ICD-10-CM

## 2023-05-07 DIAGNOSIS — N401 Enlarged prostate with lower urinary tract symptoms: Secondary | ICD-10-CM

## 2023-05-07 DIAGNOSIS — N138 Other obstructive and reflux uropathy: Secondary | ICD-10-CM

## 2023-05-07 LAB — BLADDER SCAN AMB NON-IMAGING: Scan Result: 150

## 2023-05-07 NOTE — Progress Notes (Signed)
   05/07/2023 11:24 AM   Larry Sierras 04/29/1970 [id number]  Reason for visit: Follow up BPH and urinary symptoms, PSA screening  HPI: 53 year old male who I originally met in March 2025 for a few months of worsening urinary symptoms with slow stream, nocturia up to 4 times per night, and urgency and frequency.  PVR was normal at 99ml at that time, recent PSA was normal at 1.2 and stable from prior.  IPSS score was 20.  He opted for trial of Flomax.  When he remembers to take the medication it does help him, and IPSS score today has improved significantly to 9, but quality-of-life remains mostly dissatisfied.  PVR today borderline at .  We discussed options including cystoscopy/TRUS for consideration of more definitive treatments like UroLift or HOLEP.  Risk and benefits of both procedures were reviewed extensively, patient remains unsure how he would like to proceed.  He will continue the Flomax for now, and contact us via MyChart if he opts to schedule cystoscopy TRUS for consideration of more definitive outlet procedures.  Continue Flomax, can trial double dose if interested He will contact us if he opts to pursue cystoscopy/TRUS for consideration of outlet procedures  Sondra Come, MD  [facility] [address] [place], [place] [address] [phone]

## 2023-05-07 NOTE — Patient Instructions (Signed)
 You can continue the Flomax for your urinary symptoms, the other option would be to consider surgical procedures like UroLift or HOLEP.  The best procedure for your prostate depends on the size and shape of your prostate which has to be evaluated in clinic with a cystoscopy(looking in the bladder) and a transrectal ultrasound(measuring the prostate size).  These procedures typically take less than 5 minutes total, and we would sit down afterwards to discuss which option may be best to improve your urinary symptoms.  Holmium Laser Enucleation of the Prostate (HoLEP)  HoLEP is a treatment for men with benign prostatic hyperplasia (BPH). The laser surgery removed blockages of urine flow, and is done without any incisions on the body.     What is HoLEP?  HoLEP is a type of laser surgery used to treat obstruction (blockage) of urine flow as a result of benign prostatic hyperplasia (BPH). In men with BPH, the prostate gland is not cancerous, but has become enlarged. An enlarged prostate can result in a number of urinary tract symptoms such as weak urinary stream, difficulty in starting urination, inability to urinate, frequent urination, or getting up at night to urinate.  HoLEP was developed in the 1990's as a more effective and less expensive surgical option for BPH, compared to other surgical options such as laser vaporization(PVP/greenlight laser), transurethral resection of the prostate(TURP), and open simple prostatectomy.   What happens during a HoLEP?  HoLEP requires general anesthesia ("asleep" throughout the procedure).   An antibiotic is given to reduce the risk of infection  A surgical instrument called a resectoscope is inserted through the urethra (the tube that carries urine from the bladder). The resectoscope has a camera that allows the surgeon to view the internal structure of the prostate gland, and to see where the incisions are being made during surgery.  The laser is inserted  into the resectoscope and is used to enucleate (free up) the enlarged prostate tissue from the capsule (outer shell) and then to seal up any blood vessels. The tissue that has been removed is pushed back into the bladder.  A morcellator is placed through the resectoscope, and is used to suction out the prostate tissue that has been pushed into the bladder.  When the prostate tissue has been removed, the resectoscope is removed, and a foley catheter is placed to allow healing and drain the urine from the bladder.     What happens after a HoLEP?  More than 95% of patients go home the same day a few hours after surgery. Less than 5% will be admitted to the hospital overnight for observation to monitor the urine, or if they have other medical problems.  Fluid is flushed through the catheter for about 1 hour after surgery to clear any blood from the urine. It is normal to have some blood in the urine after surgery. The need for blood transfusion is extremely rare.  Eating and drinking are permitted after the procedure once the patient has fully awakened from anesthesia.  The catheter is usually removed 2-3 days after surgery- the patient will come to clinic to have the catheter removed and make sure they can urinate on their own.  It is very important to drink lots of fluids after surgery for one week to keep the bladder flushed.  At first, there may be some burning with urination, but this typically improved within a few hours to days. Most patients do not have a significant amount of pain, and narcotic pain  medications are rarely needed.  Symptoms of urinary frequency, urgency, and even leakage are NORMAL for the first few weeks after surgery as the bladder adjusts after having to work hard against blockage from the prostate for many years. This will improve, but can sometimes take several months.  The use of pelvic floor exercises (Kegel exercises) can help improve problems with urinary  incontinence.   After catheter removal, patients will be seen at 12 weeks and 6 months for symptom check  No heavy lifting for at least 2-3 weeks after surgery, however patients can walk and do light activities the first day after surgery. Return to work time depends on occupation.    What are the advantages of HoLEP?  HoLEP has been studied in many different parts of the world and has been shown to be a safe and effective procedure. Although there are many types of BPH surgeries available, HoLEP offers a unique advantage in being able to remove a large amount of tissue without any incisions on the body, even in very large prostates, while decreasing the risk of bleeding and providing tissue for pathology (to look for cancer). This decreases the need for blood transfusions during surgery, minimizes hospital stay, and reduces the risk of needing repeat treatment.  What are the side effects of HoLEP?  Temporary burning and bleeding during urination. Some blood may be seen in the urine for weeks after surgery and is part of the healing process.  Urinary incontinence (inability to control urine flow) is expected in all patients immediately after surgery and they should wear pads for the first few days/weeks. This typically improves over the course of several weeks. Performing Kegel exercises can help decrease leakage from stress maneuvers such as coughing, sneezing, or lifting. The rate of long term leakage is very low, 1-2%. Patients may also have leakage with urgency and this may be treated with medication. The risk of urge incontinence can be dependent on several factors including age, prostate size, symptoms, and other medical problems.  Retrograde ejaculation or "backwards ejaculation." In 80% of cases, the patient will not see any fluid during ejaculation after surgery.  Erectile function is generally not significantly affected.   What are the risks of HoLEP?  Injury to the urethra or  development of scar tissue at a later date  Injury to the capsule of the prostate (typically treated with longer catheterization).  Injury to the bladder or ureteral orifices (where the urine from the kidney drains out)  Infection of the bladder, testes, or kidneys (~4%)  Return of urinary obstruction at a later date requiring another operation (<2%)  Need for blood transfusion or re-operation due to bleeding  Failure to relieve all symptoms and/or need for prolonged catheterization after surgery  5-15% of patients are found to have previously undiagnosed prostate cancer in their specimen. Prostate cancer can be treated after HoLEP.  Standard risks of anesthesia including blood clots, heart attacks, etc ~1-2% risk of long term urinary incontinence (leakage)  When should I call my doctor?  Fever over 101.3 degrees  Inability to urinate, or large blood clots in the urine   ---------------------------------------------  Prostatic Urethral Lift (UroLift)  Prostatic urethral lift is a surgical procedure to treat symptoms of prostate gland enlargement that occurs with age (benign prostatic hypertrophy, BPH). The urethra passes between the two lobes of the prostate. The urethra is the part of the body that drains urine from the bladder. As the prostate enlarges, it can push on the urethra  and cause problems with urinating. This procedure involves placing an implant that holds the prostate away from the urethra. The procedure is done using a thin device called a cystoscope. The device is inserted through the tip of the penis and moved up the urethra to the prostate. This is less invasive than other procedures that require an incision. You may have this procedure if: You have symptoms of BPH. Your prostate is not severely enlarged. Medicines to treat BPH are not working or not tolerated. You want to avoid possible sexual side effects from medicines or other procedures that are used to treat  BPH. Tell a health care provider about: Any allergies you have. All medicines you are taking, including vitamins, herbs, eye drops, creams, and over-the-counter medicines. Any problems you or family members have had with anesthetic medicines. Any bleeding problems you have. Any surgeries you have had. Any medical conditions you have. What are the risks? Generally, this is a safe procedure. However, problems may occur, including: Bleeding. Infection. Leaking of urine (incontinence). Allergic reactions to medicines. Return of BPH symptoms after 2 years, requiring more treatment. What happens before the procedure? When to stop eating and drinking Follow instructions from your health care provider about what you may eat and drink before your procedure. These may include: 8 hours before your procedure Stop eating most foods. Do not eat meat, fried foods, or fatty foods. Eat only light foods, such as toast or crackers. All liquids are okay except energy drinks and alcohol. 6 hours before your procedure Stop eating. Drink only clear liquids, such as water, clear fruit juice, black coffee, plain tea, and sports drinks. Do not drink energy drinks or alcohol. 2 hours before your procedure Stop drinking all liquids. You may be allowed to take medicines with small sips of water. If you do not follow your health care provider's instructions, your procedure may be delayed or canceled. Medicines Ask your health care provider about: Changing or stopping your regular medicines. This is especially important if you are taking diabetes medicines or blood thinners. Taking medicines such as aspirin and ibuprofen. These medicines can thin your blood. Do not take these medicines unless your health care provider tells you to take them. Taking over-the-counter medicines, vitamins, herbs, and supplements. Surgery safety Ask your health care provider what steps will be taken to help prevent infection. These  steps may include: Removing hair at the surgery site. Washing skin with a germ-killing soap. Taking antibiotic medicine. General instructions Do not use any products that contain nicotine or tobacco for at least 4 weeks before the procedure. These products include cigarettes, chewing tobacco, and vaping devices, such as e-cigarettes. If you need help quitting, ask your health care provider. If you will be going home right after the procedure, plan to have a responsible adult: Take you home from the hospital or clinic. You will not be allowed to drive. Care for you for the time you are told. What happens during the procedure? An IV may be inserted into one of your veins. You will be given one or more of the following: A medicine to help you relax (sedative). A medicine that is injected into your urethra to numb the area (local anesthetic). A medicine to make you fall asleep (general anesthetic). A cystoscope will be inserted into your penis and moved through your urethra to your prostate. A device will be inserted through the cystoscope and used to press the lobes of your prostate away from your urethra. Implants will  be inserted through the device to hold the lobes of your prostate in the widened position. The device and cystoscope will be removed. The procedure may vary among health care providers and hospitals. What happens after the procedure? Your blood pressure, heart rate, breathing rate, and blood oxygen level be monitored until you leave the hospital or clinic. If you were given a sedative during the procedure, it can affect you for several hours. Do not drive or operate machinery until your health care provider says that it is safe. Summary Prostatic urethral lift is a surgical procedure to relieve symptoms of prostate gland enlargement that occurs with age (benign prostatic hypertrophy, BPH). The procedure is performed with a thin device called a cystoscope. This device is inserted  through the tip of the penis and moved up the urethra to reach the prostate. This is less invasive than other procedures that require an incision. If you will be going home right after the procedure, plan to have a responsible adult take you home from the hospital or clinic. You will not be allowed to drive. This information is not intended to replace advice given to you by your health care provider. Make sure you discuss any questions you have with your health care provider. Document Revised: 08/11/2020 Document Reviewed: 08/11/2020 Elsevier Patient Education  2024 [employer].

## 2023-05-15 ENCOUNTER — Telehealth: Payer: Self-pay | Admitting: Physician Assistant

## 2023-05-15 NOTE — Telephone Encounter (Signed)
 Left vm and sent mychart message to confirm 05/22/23 appointment-Toni

## 2023-05-22 ENCOUNTER — Encounter: Payer: Self-pay | Admitting: Physician Assistant

## 2023-05-22 ENCOUNTER — Ambulatory Visit (INDEPENDENT_AMBULATORY_CARE_PROVIDER_SITE_OTHER): Payer: 59 | Admitting: Physician Assistant

## 2023-05-22 ENCOUNTER — Telehealth: Payer: Self-pay | Admitting: Physician Assistant

## 2023-05-22 VITALS — BP 122/80 | HR 76 | Temp 98.2°F | Resp 16 | Ht 69.0 in | Wt 219.8 lb

## 2023-05-22 DIAGNOSIS — N289 Disorder of kidney and ureter, unspecified: Secondary | ICD-10-CM

## 2023-05-22 DIAGNOSIS — N401 Enlarged prostate with lower urinary tract symptoms: Secondary | ICD-10-CM

## 2023-05-22 DIAGNOSIS — Z0001 Encounter for general adult medical examination with abnormal findings: Secondary | ICD-10-CM

## 2023-05-22 DIAGNOSIS — N1831 Chronic kidney disease, stage 3a: Secondary | ICD-10-CM | POA: Diagnosis not present

## 2023-05-22 NOTE — Progress Notes (Signed)
 [facility] [address] [place], [place] [address]  Internal MEDICINE  Office Visit Note  Patient Name: Ricky Ross  [id number]  [id number]  Date of Service: 05/22/2023  Chief Complaint  Patient presents with   Annual Exam     HPI Pt is here for routine health maintenance examination -Did see urology, has enlarged prostate and is now on flomax .  -Colonoscopy due in Oct 2025 -will look into shingles and PNA vaccines -labs previously reviewed-will update renal US  -a little ankle swelling, will try compression stockings  Current Medication: Outpatient Encounter Medications as of 05/22/2023  Medication Sig   Multiple Vitamin (MULTI-VITAMINS) TABS Take by mouth.   tamsulosin  (FLOMAX ) 0.4 MG CAPS capsule Take 1 capsule (0.4 mg total) by mouth daily.   No facility-administered encounter medications on file as of 05/22/2023.    Surgical History: Past Surgical History:  Procedure Laterality Date   CARDIAC CATHETERIZATION N/A 03/22/2015   Procedure: Right/Left Heart Cath and Coronary Angiography;  Surgeon: Michelle Aid, MD;  Location: [facility] INVASIVE CV LAB;  Service: Cardiovascular;  Laterality: N/A;   COLONOSCOPY WITH PROPOFOL  N/A 11/27/2018   Procedure: COLONOSCOPY WITH PROPOFOL ;  Surgeon: Irby Mannan, MD;  Location: [facility] ENDOSCOPY;  Service: Endoscopy;  Laterality: N/A;   MITRAL VALVE REPAIR     TEE WITHOUT CARDIOVERSION N/A 03/22/2015   Procedure: TRANSESOPHAGEAL ECHOCARDIOGRAM (TEE);  Surgeon: Michelle Aid, MD;  Location: [facility] ORS;  Service: Cardiovascular;  Laterality: N/A;    Medical History: Past Medical History:  Diagnosis Date   Heart murmur    Mitral valve disorder     Family History: Family History  Family history unknown: Yes      Review of Systems  Constitutional:  Negative for chills, fatigue and unexpected weight change.  HENT:  Negative for congestion, postnasal drip, rhinorrhea, sneezing and sore throat.   Eyes:   Negative for redness.  Respiratory:  Negative for cough, chest tightness and shortness of breath.   Cardiovascular:  Negative for chest pain and palpitations.  Gastrointestinal:  Negative for abdominal pain, constipation, diarrhea, nausea and vomiting.  Genitourinary:  Negative for dysuria.  Musculoskeletal:  Negative for arthralgias, back pain, joint swelling and neck pain.  Skin:  Negative for rash.  Neurological: Negative.  Negative for tremors and numbness.  Hematological:  Negative for adenopathy. Does not bruise/bleed easily.  Psychiatric/Behavioral:  Negative for behavioral problems (Depression), sleep disturbance and suicidal ideas. The patient is not nervous/anxious.      Vital Signs: BP 122/80   Pulse 76   Temp 98.2 F (36.8 C)   Resp 16   Ht 5\' 9"  (1.753 m)   Wt 219 lb 12.8 oz (99.7 kg)   SpO2 97%   BMI 32.46 kg/m    Physical Exam Vitals and nursing note reviewed.  Constitutional:      General: He is not in acute distress.    Appearance: Normal appearance. He is obese. He is not ill-appearing.  HENT:     Head: Normocephalic and atraumatic.  Eyes:     Extraocular Movements: Extraocular movements intact.     Pupils: Pupils are equal, round, and reactive to light.  Cardiovascular:     Rate and Rhythm: Normal rate and regular rhythm.  Pulmonary:     Effort: Pulmonary effort is normal. No respiratory distress.  Abdominal:     General: Bowel sounds are normal.     Palpations: Abdomen is soft.     Tenderness: There is no abdominal tenderness.  Musculoskeletal:        General: Normal range of motion.     Cervical back: Normal range of motion.  Skin:    General: Skin is warm and dry.  Neurological:     Mental Status: He is alert and oriented to person, place, and time.  Psychiatric:        Mood and Affect: Mood normal.        Behavior: Behavior normal.      LABS: Recent Results (from the past 2160 hours)  CBC w/Diff/Platelet     Status: None   Collection  Time: 03/10/23 10:31 AM  Result Value Ref Range   WBC 4.0 3.4 - 10.8 x10E3/uL   RBC 5.45 4.14 - 5.80 x10E6/uL   Hemoglobin 16.3 13.0 - 17.7 g/dL   Hematocrit 82.9 56.2 - 51.0 %   MCV 92 79 - 97 fL   MCH 29.9 26.6 - 33.0 pg   MCHC 32.7 31.5 - 35.7 g/dL   RDW 13.0 86.5 - 78.4 %   Platelets 179 150 - 450 x10E3/uL   Neutrophils 46 Not Estab. %   Lymphs 40 Not Estab. %   Monocytes 11 Not Estab. %   Eos 1 Not Estab. %   Basos 1 Not Estab. %   Neutrophils Absolute 1.9 1.4 - 7.0 x10E3/uL   Lymphocytes Absolute 1.6 0.7 - 3.1 x10E3/uL   Monocytes Absolute 0.4 0.1 - 0.9 x10E3/uL   EOS (ABSOLUTE) 0.0 0.0 - 0.4 x10E3/uL   Basophils Absolute 0.0 0.0 - 0.2 x10E3/uL   Immature Granulocytes 1 Not Estab. %   Immature Grans (Abs) 0.0 0.0 - 0.1 x10E3/uL  Comprehensive metabolic panel     Status: Abnormal   Collection Time: 03/10/23 10:31 AM  Result Value Ref Range   Glucose 80 70 - 99 mg/dL   BUN 15 6 - 24 mg/dL   Creatinine, Ser 6.96 (H) 0.76 - 1.27 mg/dL   eGFR 53 (L) >29 BM/WUX/3.24   BUN/Creatinine Ratio 10 9 - 20   Sodium 138 134 - 144 mmol/L   Potassium CANCELED mmol/L    Comment: Test not performed. Specimen is hemolyzed. Unable to obtain valid results.  Result canceled by the ancillary.    Chloride 101 96 - 106 mmol/L   CO2 21 20 - 29 mmol/L   Calcium 9.0 8.7 - 10.2 mg/dL   Total Protein 7.1 6.0 - 8.5 g/dL   Albumin 4.3 3.8 - 4.9 g/dL   Globulin, Total 2.8 1.5 - 4.5 g/dL   Bilirubin Total 0.4 0.0 - 1.2 mg/dL   Alkaline Phosphatase 55 44 - 121 IU/L   AST 39 0 - 40 IU/L   ALT 20 0 - 44 IU/L  TSH + free T4     Status: None   Collection Time: 03/10/23 10:31 AM  Result Value Ref Range   TSH 1.650 0.450 - 4.500 uIU/mL   Free T4 1.26 0.82 - 1.77 ng/dL  Lipid Panel With LDL/HDL Ratio     Status: Abnormal   Collection Time: 03/10/23 10:31 AM  Result Value Ref Range   Cholesterol, Total 191 100 - 199 mg/dL   Triglycerides 69 0 - 149 mg/dL   HDL 54 >40 mg/dL   VLDL Cholesterol Cal  13 5 - 40 mg/dL   LDL Chol Calc (NIH) 102 (H) 0 - 99 mg/dL   LDL/HDL Ratio 2.3 0.0 - 3.6 ratio    Comment:  LDL/HDL Ratio                                             Men  Women                               1/2 Avg.Risk  1.0    1.5                                   Avg.Risk  3.6    3.2                                2X Avg.Risk  6.2    5.0                                3X Avg.Risk  8.0    6.1   PSA Total (Reflex To Free)     Status: None   Collection Time: 03/10/23 10:31 AM  Result Value Ref Range   Prostate Specific Ag, Serum 1.2 0.0 - 4.0 ng/mL    Comment: Roche ECLIA methodology. According to the American Urological Association, Serum PSA should decrease and remain at undetectable levels after radical prostatectomy. The AUA defines biochemical recurrence as an initial PSA value 0.2 ng/mL or greater followed by a subsequent confirmatory PSA value 0.2 ng/mL or greater. Values obtained with different assay methods or kits cannot be used interchangeably. Results cannot be interpreted as absolute evidence of the presence or absence of malignant disease.    Reflex Criteria Comment     Comment: The percent free PSA is performed on a reflex basis only when the total PSA is between 4.0 and 10.0 ng/mL.   Hgb A1C w/o eAG     Status: Abnormal   Collection Time: 03/10/23 10:31 AM  Result Value Ref Range   Hgb A1c MFr Bld 5.9 (H) 4.8 - 5.6 %    Comment:          Prediabetes: 5.7 - 6.4          Diabetes: >6.4          Glycemic control for adults with diabetes: <7.0   Urinalysis, Complete w Microscopic -     Status: Abnormal   Collection Time: 04/02/23 10:17 AM  Result Value Ref Range   Color, Urine YELLOW YELLOW   APPearance CLEAR CLEAR   Specific Gravity, Urine <1.005 (L) 1.005 - 1.030   pH 6.5 5.0 - 8.0   Glucose, UA NEGATIVE NEGATIVE mg/dL   Hgb urine dipstick NEGATIVE NEGATIVE   Bilirubin Urine NEGATIVE NEGATIVE   Ketones, ur NEGATIVE NEGATIVE  mg/dL   Protein, ur NEGATIVE NEGATIVE mg/dL   Nitrite NEGATIVE NEGATIVE   Leukocytes,Ua NEGATIVE NEGATIVE   Squamous Epithelial / HPF NONE SEEN 0 - 5 /HPF   WBC, UA NONE SEEN 0 - 5 WBC/hpf   RBC / HPF NONE SEEN 0 - 5 RBC/hpf   Bacteria, UA NONE SEEN NONE SEEN    Comment: Performed at [facility], [address] [place], [place] [address]  Bladder Scan (Post Void Residual) in office     Status:  None   Collection Time: 04/02/23 10:38 AM  Result Value Ref Range   Scan Result 99   Bladder Scan (Post Void Residual) in office     Status: None   Collection Time: 05/07/23 10:53 AM  Result Value Ref Range   Scan Result 150         Assessment/Plan: 1. Encounter for general adult medical examination with abnormal findings (Primary) CPE performed, labs previously reviewed, due for colonoscopy in Oct  2. Stage 3a chronic kidney disease (HCC) Will update renal US  - US  Renal; Future  3. Renal lesion Will update renal US , previous imaging favored small benign left renal lesion - US  Renal; Future  4. Benign prostatic hyperplasia with lower urinary tract symptoms, symptom details unspecified Followed by urology, on flomax    General Counseling: Faizan verbalizes understanding of the findings of todays visit and agrees with plan of treatment. I have discussed any further diagnostic evaluation that may be needed or ordered today. We also reviewed his medications today. he has been encouraged to call the office with any questions or concerns that should arise related to todays visit.    Counseling:    Orders Placed This Encounter  Procedures   US  Renal    No orders of the defined types were placed in this encounter.   This patient was seen by Taylor Favia, PA-C in collaboration with Dr. Verneta Gone as a part of collaborative care agreement.  Total time spent:35 Minutes  Time spent includes review of chart, medications, test results, and follow up plan with the  patient.     Lawton Price, MD  Internal Medicine

## 2023-05-22 NOTE — Telephone Encounter (Signed)
 Lvm regarding u/s and follow up-Toni

## 2023-05-29 ENCOUNTER — Ambulatory Visit
Admission: RE | Admit: 2023-05-29 | Discharge: 2023-05-29 | Disposition: A | Discharge status: Home / Self Care | Source: Ambulatory Visit | Attending: Physician Assistant | Admitting: Physician Assistant

## 2023-05-29 DIAGNOSIS — N289 Disorder of kidney and ureter, unspecified: Secondary | ICD-10-CM | POA: Insufficient documentation

## 2023-05-29 DIAGNOSIS — N1831 Chronic kidney disease, stage 3a: Secondary | ICD-10-CM | POA: Insufficient documentation

## 2023-06-19 ENCOUNTER — Encounter: Payer: Self-pay | Admitting: Physician Assistant

## 2023-06-19 ENCOUNTER — Telehealth: Payer: Self-pay | Admitting: Physician Assistant

## 2023-06-19 ENCOUNTER — Ambulatory Visit (INDEPENDENT_AMBULATORY_CARE_PROVIDER_SITE_OTHER): Admitting: Physician Assistant

## 2023-06-19 VITALS — BP 134/88 | HR 70 | Temp 98.5°F | Resp 16 | Ht 69.0 in | Wt 221.2 lb

## 2023-06-19 DIAGNOSIS — N1831 Chronic kidney disease, stage 3a: Secondary | ICD-10-CM

## 2023-06-19 DIAGNOSIS — W57XXXA Bitten or stung by nonvenomous insect and other nonvenomous arthropods, initial encounter: Secondary | ICD-10-CM

## 2023-06-19 DIAGNOSIS — N289 Disorder of kidney and ureter, unspecified: Secondary | ICD-10-CM | POA: Diagnosis not present

## 2023-06-19 NOTE — Progress Notes (Signed)
 [facility] [address] [place], [place] [address]  Internal MEDICINE  Office Visit Note  Patient Name: Ricky Ross  [id number]  [id number]  Date of Service: 06/19/2023  Chief Complaint  Patient presents with   Follow-up    Review renal U/S   Insect Bite    Left arm insect bite, noticed it 3 days ago. Not itchy.    HPI Pt is here for routine follow up -Bug bite on left arm last weekend. Not itchy or painful. Was outside cutting grass. Didn't feel it when it happened. A little pus in center at first but this has resolved and now just slight scab. Did not see any ticks. No ring around lesion. Slowly improving.  -Renal US  05/29/23:  IMPRESSION: 1. 1.3 cm hypoechoic mass in the upper pole of left kidney. This was not definitely seen on prior CT scan. Recommend further evaluation with renal protocol CT or MRI. 2. 1.3 cm simple cyst in the mid pole lateral left kidney correlating to the CT noted mass from CT of January 2021. No follow-up is recommended for this lesion  -After discussing options, pt would like to try for MRI for better characterization of new lesion. May consider nephrology referral in future  Current Medication: Outpatient Encounter Medications as of 06/19/2023  Medication Sig   Multiple Vitamin (MULTI-VITAMINS) TABS Take by mouth.   tamsulosin  (FLOMAX ) 0.4 MG CAPS capsule Take 1 capsule (0.4 mg total) by mouth daily.   No facility-administered encounter medications on file as of 06/19/2023.    Surgical History: Past Surgical History:  Procedure Laterality Date   CARDIAC CATHETERIZATION N/A 03/22/2015   Procedure: Right/Left Heart Cath and Coronary Angiography;  Surgeon: Michelle Aid, MD;  Location: [facility] INVASIVE CV LAB;  Service: Cardiovascular;  Laterality: N/A;   COLONOSCOPY WITH PROPOFOL  N/A 11/27/2018   Procedure: COLONOSCOPY WITH PROPOFOL ;  Surgeon: Irby Mannan, MD;  Location: [facility] ENDOSCOPY;  Service: Endoscopy;  Laterality: N/A;    MITRAL VALVE REPAIR     TEE WITHOUT CARDIOVERSION N/A 03/22/2015   Procedure: TRANSESOPHAGEAL ECHOCARDIOGRAM (TEE);  Surgeon: Michelle Aid, MD;  Location: [facility] ORS;  Service: Cardiovascular;  Laterality: N/A;    Medical History: Past Medical History:  Diagnosis Date   Heart murmur    Mitral valve disorder     Family History: Family History  Family history unknown: Yes    Social History   Socioeconomic History   Marital status: Married    Spouse name: Not on file   Number of children: Not on file   Years of education: Not on file   Highest education level: Not on file  Occupational History   Not on file  Tobacco Use   Smoking status: Never   Smokeless tobacco: Never  Vaping Use   Vaping status: Never Used  Substance and Sexual Activity   Alcohol use: No   Drug use: No   Sexual activity: Not on file  Other Topics Concern   Not on file  Social History Narrative   Not on file   Social Drivers of Health   Financial Resource Strain: Not on file  Food Insecurity: Not on file  Transportation Needs: Not on file  Physical Activity: Not on file  Stress: Not on file  Social Connections: Not on file  Intimate Partner Violence: Not on file      Review of Systems  Constitutional:  Negative for chills, fatigue and unexpected weight change.  HENT:  Negative for congestion, postnasal drip,  rhinorrhea, sneezing and sore throat.   Eyes:  Negative for redness.  Respiratory:  Negative for cough, chest tightness and shortness of breath.   Cardiovascular:  Negative for chest pain and palpitations.  Gastrointestinal:  Negative for abdominal pain, constipation, diarrhea, nausea and vomiting.  Genitourinary:  Negative for dysuria.  Musculoskeletal:  Negative for arthralgias, back pain, joint swelling and neck pain.  Skin:  Positive for wound. Negative for rash.       Bug bite on left arm, not itchy or painful  Neurological: Negative.  Negative for tremors and numbness.   Hematological:  Negative for adenopathy. Does not bruise/bleed easily.  Psychiatric/Behavioral:  Negative for behavioral problems (Depression), sleep disturbance and suicidal ideas. The patient is not nervous/anxious.     Vital Signs: BP 134/88 Comment: 128/90  Pulse 70   Temp 98.5 F (36.9 C)   Resp 16   Ht 5\' 9"  (1.753 m)   Wt 221 lb 3.2 oz (100.3 kg)   SpO2 98%   BMI 32.67 kg/m    Physical Exam Vitals and nursing note reviewed.  Constitutional:      General: He is not in acute distress.    Appearance: Normal appearance. He is obese. He is not ill-appearing.  HENT:     Head: Normocephalic and atraumatic.  Eyes:     Extraocular Movements: Extraocular movements intact.  Cardiovascular:     Rate and Rhythm: Normal rate and regular rhythm.  Pulmonary:     Effort: Pulmonary effort is normal. No respiratory distress.  Skin:    General: Skin is warm and dry.     Findings: Lesion present.     Comments: Small bug bite along left upper biceps, no surrounding erythema or warmth, no drainage. Non TTP.  Neurological:     Mental Status: He is alert and oriented to person, place, and time.  Psychiatric:        Mood and Affect: Mood normal.        Behavior: Behavior normal.        Assessment/Plan: 1. Renal lesion (Primary) Previous lesion confirmed as cyst, however new lesion requires further imaging and will move forward with MRI for better characterization - MR Abdomen W Wo Contrast; Future  2. Stage 3a chronic kidney disease (HCC) - MR Abdomen W Wo Contrast; Future  3. Bug bite, initial encounter No signs of infection on exam but advised to monitor and call if any changes. May use cortisone cream if it becomes itchy.   General Counseling: Savien verbalizes understanding of the findings of todays visit and agrees with plan of treatment. I have discussed any further diagnostic evaluation that may be needed or ordered today. We also reviewed his medications today. he has  been encouraged to call the office with any questions or concerns that should arise related to todays visit.    Orders Placed This Encounter  Procedures   MR Abdomen W Wo Contrast    No orders of the defined types were placed in this encounter.   This patient was seen by Taylor Favia, PA-C in collaboration with Dr. Verneta Gone as a part of collaborative care agreement.   Total time spent:30 Minutes Time spent includes review of chart, medications, test results, and follow up plan with the patient.      Dr Fozia M Khan Internal medicine

## 2023-06-19 NOTE — Telephone Encounter (Signed)
 Notified patient of MRI  appointment date, arrival time, location and npo 4 hrs prior-Ricky Ross

## 2023-06-26 ENCOUNTER — Ambulatory Visit

## 2023-07-18 ENCOUNTER — Ambulatory Visit: Admitting: Physician Assistant

## 2023-08-27 ENCOUNTER — Ambulatory Visit (INDEPENDENT_AMBULATORY_CARE_PROVIDER_SITE_OTHER): Admitting: Nurse Practitioner

## 2023-08-27 ENCOUNTER — Encounter: Payer: Self-pay | Admitting: Nurse Practitioner

## 2023-08-27 VITALS — BP 130/86 | HR 71 | Temp 96.1°F | Resp 16 | Ht 69.0 in | Wt 221.6 lb

## 2023-08-27 DIAGNOSIS — R220 Localized swelling, mass and lump, head: Secondary | ICD-10-CM

## 2023-08-27 DIAGNOSIS — L270 Generalized skin eruption due to drugs and medicaments taken internally: Secondary | ICD-10-CM | POA: Diagnosis not present

## 2023-08-27 NOTE — Progress Notes (Signed)
 [facility] [address] [place], [place] [address]  Internal MEDICINE  Office Visit Note  Patient Name: Ricky Ross  [id number]  [id number]  Date of Service: 08/27/2023  Chief Complaint  Patient presents with   Acute Visit    Rash on tongue/mouth      HPI Ricky Ross presents for an acute sick visit for rash on tongue and skin.  --onset of symptom was last night. About 1 week ago, He started using a prescription toothpaste called Clinpro 5000 for sensitivity that has 1.1% sodium fluoride in it and an ingredient called tri-calcium phosphate.  He has bumps on his tongue and a rash on his upper body that is not itchy. And is just pinpoint red dots. Reports swelling of tongue but states that the bumps are not painful or itchy.  Swelling of tongue is mild and only in the front  on the tongue. No issues with swallowing or breathing.  He has not tried any new foods or any other new medications.     Current Medication:  Outpatient Encounter Medications as of 08/27/2023  Medication Sig   Multiple Vitamin (MULTI-VITAMINS) TABS Take by mouth.   tamsulosin  (FLOMAX ) 0.4 MG CAPS capsule Take 1 capsule (0.4 mg total) by mouth daily.   No facility-administered encounter medications on file as of 08/27/2023.      Medical History: Past Medical History:  Diagnosis Date   Heart murmur    Mitral valve disorder      Vital Signs: BP 130/86   Pulse 71   Temp (!) 96.1 F (35.6 C)   Resp 16   Ht 5' 9 (1.753 m)   Wt 221 lb 9.6 oz (100.5 kg)   SpO2 99%   BMI 32.72 kg/m    Review of Systems  HENT:  Positive for mouth sores (bumps on tongue, not sore, painful or itchy).   Cardiovascular:  Negative for chest pain and palpitations.  Skin:  Positive for rash.    Physical Exam Vitals reviewed.  Constitutional:      General: He is not in acute distress.    Appearance: Normal appearance. He is obese. He is not ill-appearing.  HENT:     Head: Normocephalic and atraumatic.      Mouth/Throat:     Tongue: Lesions (mild swelling of tongue and bumps that are not painful or itchy) present.  Cardiovascular:     Rate and Rhythm: Normal rate and regular rhythm.  Pulmonary:     Effort: Pulmonary effort is normal. No respiratory distress.  Neurological:     Mental Status: He is alert and oriented to person, place, and time.  Psychiatric:        Mood and Affect: Mood normal.        Behavior: Behavior normal.       Assessment/Plan: 1. Mild tongue swelling (Primary) Stop using prescription toothpaste and discuss alternatives with dentist.   2. Drug rash Should resolve on its own if stopping using prescription toothpaste. Discuss alternatives with dentist.    General Counseling: Ricky Ross verbalizes understanding of the findings of todays visit and agrees with plan of treatment. I have discussed any further diagnostic evaluation that may be needed or ordered today. We also reviewed his medications today. he has been encouraged to call the office with any questions or concerns that should arise related to todays visit.    Counseling:    No orders of the defined types were placed in this encounter.   No orders of the  defined types were placed in this encounter.   Return if symptoms worsen or fail to improve, for needs to talk to his dentist for alternative toothpaste. keep regular f/u with lauren pcp.  [place] Controlled Substance Database was reviewed by me for overdose risk score (ORS)  Time spent:20 Minutes Time spent with patient included reviewing progress notes, labs, imaging studies, and discussing plan for follow up.   This patient was seen by Mardy Maxin, FNP-C in collaboration with Dr. Sigrid Bathe as a part of collaborative care agreement.  Brianna Miklos R. Maxin, MSN, FNP-C Internal Medicine

## 2023-09-02 ENCOUNTER — Ambulatory Visit (INDEPENDENT_AMBULATORY_CARE_PROVIDER_SITE_OTHER): Admitting: Nurse Practitioner

## 2023-09-02 ENCOUNTER — Encounter: Payer: Self-pay | Admitting: Nurse Practitioner

## 2023-09-02 VITALS — BP 134/88 | HR 79 | Temp 97.2°F | Resp 16 | Ht 69.0 in | Wt 221.4 lb

## 2023-09-02 DIAGNOSIS — E538 Deficiency of other specified B group vitamins: Secondary | ICD-10-CM

## 2023-09-02 DIAGNOSIS — R200 Anesthesia of skin: Secondary | ICD-10-CM

## 2023-09-02 DIAGNOSIS — E559 Vitamin D deficiency, unspecified: Secondary | ICD-10-CM

## 2023-09-02 DIAGNOSIS — R220 Localized swelling, mass and lump, head: Secondary | ICD-10-CM | POA: Diagnosis not present

## 2023-09-02 NOTE — Progress Notes (Unsigned)
 [facility] [address] [place], [place] [address]  Internal MEDICINE  Office Visit Note  Patient Name: Donnelle Olmeda  [id number]  [id number]  Date of Service: 09/02/2023  Chief Complaint  Patient presents with   Acute Visit    Oral allergic reaction.      HPI Shaughn presents for an acute sick visit for tongue numbness.  The bumps and discoloration of the tongue has resolved but he reports that he is still having numbness of the tip of his tongue. This could be residual effect that just has not had time to resolve. Patient is requesting labs to make sure the specialized toothpaste did not throw his labs off.      Current Medication:  Outpatient Encounter Medications as of 09/02/2023  Medication Sig   Multiple Vitamin (MULTI-VITAMINS) TABS Take by mouth.   tamsulosin  (FLOMAX ) 0.4 MG CAPS capsule Take 1 capsule (0.4 mg total) by mouth daily.   No facility-administered encounter medications on file as of 09/02/2023.      Medical History: Past Medical History:  Diagnosis Date   Heart murmur    Mitral valve disorder      Vital Signs: BP 134/88   Pulse 79   Temp (!) 97.2 F (36.2 C)   Resp 16   Ht 5' 9 (1.753 m)   Wt 221 lb 6.4 oz (100.4 kg)   SpO2 97%   BMI 32.70 kg/m    Review of Systems  HENT:  Negative for mouth sores.        Bumps on tongue have resolve but still having tongue numbness.   Respiratory: Negative.  Negative for cough, chest tightness, shortness of breath and wheezing.   Cardiovascular:  Negative for chest pain and palpitations.  Skin: Negative.  Negative for rash (rash on skin has resolved.).    Physical Exam Vitals reviewed.  Constitutional:      General: He is not in acute distress.    Appearance: Normal appearance. He is obese. He is not ill-appearing.  HENT:     Head: Normocephalic and atraumatic.     Mouth/Throat:     Tongue: No lesions (bumps have resolved).  Cardiovascular:     Rate and Rhythm: Normal rate and  regular rhythm.  Pulmonary:     Effort: Pulmonary effort is normal. No respiratory distress.  Neurological:     Mental Status: He is alert and oriented to person, place, and time.  Psychiatric:        Mood and Affect: Mood normal.        Behavior: Behavior normal.       Assessment/Plan: 1. Numbness of tongue (Primary) Routine labs ordered - CBC with Differential/Platelet - CMP14+EGFR - B12 and Folate Panel - Vitamin D  (25 hydroxy) - Iron, TIBC and Ferritin Panel  2. Mild tongue swelling Routine labs ordered  - CBC with Differential/Platelet - CMP14+EGFR - B12 and Folate Panel - Vitamin D  (25 hydroxy) - Iron, TIBC and Ferritin Panel  3. B12 deficiency Routine labs ordered  - CBC with Differential/Platelet - CMP14+EGFR - B12 and Folate Panel - Vitamin D  (25 hydroxy) - Iron, TIBC and Ferritin Panel  4. Vitamin D  deficiency Routine labs ordered  - CBC with Differential/Platelet - CMP14+EGFR - B12 and Folate Panel - Vitamin D  (25 hydroxy) - Iron, TIBC and Ferritin Panel   General Counseling: Treshun verbalizes understanding of the findings of todays visit and agrees with plan of treatment. I have discussed any further diagnostic evaluation that may be  needed or ordered today. We also reviewed his medications today. he has been encouraged to call the office with any questions or concerns that should arise related to todays visit.    Counseling:    Orders Placed This Encounter  Procedures   CBC with Differential/Platelet   CMP14+EGFR   B12 and Folate Panel   Vitamin D  (25 hydroxy)   Iron, TIBC and Ferritin Panel    No orders of the defined types were placed in this encounter.   Return in about 2 weeks (around 09/16/2023) for F/U for lab results with lauren pcp.  [place] Controlled Substance Database was reviewed by me for overdose risk score (ORS)  Time spent:30 Minutes Time spent with patient included reviewing progress notes, labs, imaging studies, and  discussing plan for follow up.   This patient was seen by Mardy Maxin, FNP-C in collaboration with Dr. Sigrid Bathe as a part of collaborative care agreement.  Danya Spearman R. Maxin, MSN, FNP-C Internal Medicine

## 2023-09-03 ENCOUNTER — Encounter: Payer: Self-pay | Admitting: Nurse Practitioner

## 2023-09-04 LAB — CMP14+EGFR
ALT: 19 IU/L (ref 0–44)
AST: 22 IU/L (ref 0–40)
Albumin: 4.4 g/dL (ref 3.8–4.9)
Alkaline Phosphatase: 51 IU/L (ref 44–121)
BUN/Creatinine Ratio: 10 (ref 9–20)
BUN: 14 mg/dL (ref 6–24)
Bilirubin Total: 0.6 mg/dL (ref 0.0–1.2)
CO2: 19 mmol/L — ABNORMAL LOW (ref 20–29)
Calcium: 9.1 mg/dL (ref 8.7–10.2)
Chloride: 103 mmol/L (ref 96–106)
Creatinine, Ser: 1.39 mg/dL — ABNORMAL HIGH (ref 0.76–1.27)
Globulin, Total: 2.6 g/dL (ref 1.5–4.5)
Glucose: 82 mg/dL (ref 70–99)
Potassium: 4.3 mmol/L (ref 3.5–5.2)
Sodium: 140 mmol/L (ref 134–144)
Total Protein: 7 g/dL (ref 6.0–8.5)
eGFR: 60 mL/min/1.73 (ref >59)

## 2023-09-04 LAB — IRON,TIBC AND FERRITIN PANEL
Ferritin: 248 ng/mL (ref 30–400)
Iron Saturation: 37 % (ref 15–55)
Iron: 98 ug/dL (ref 38–169)
Total Iron Binding Capacity: 265 ug/dL (ref 250–450)
UIBC: 167 ug/dL (ref 111–343)

## 2023-09-04 LAB — CBC WITH DIFFERENTIAL/PLATELET
Basophils Absolute: 0 x10E3/uL (ref 0.0–0.2)
Basos: 1 %
EOS (ABSOLUTE): 0.1 x10E3/uL (ref 0.0–0.4)
Eos: 1 %
Hematocrit: 51 % (ref 37.5–51.0)
Hemoglobin: 16.1 g/dL (ref 13.0–17.7)
Immature Grans (Abs): 0 x10E3/uL (ref 0.0–0.1)
Immature Granulocytes: 0 %
Lymphocytes Absolute: 1.6 x10E3/uL (ref 0.7–3.1)
Lymphs: 37 %
MCH: 29.7 pg (ref 26.6–33.0)
MCHC: 31.6 g/dL (ref 31.5–35.7)
MCV: 94 fL (ref 79–97)
Monocytes Absolute: 0.5 x10E3/uL (ref 0.1–0.9)
Monocytes: 11 %
Neutrophils Absolute: 2 x10E3/uL (ref 1.4–7.0)
Neutrophils: 49 %
Platelets: 164 x10E3/uL (ref 150–450)
RBC: 5.42 x10E6/uL (ref 4.14–5.80)
RDW: 13.7 % (ref 11.6–15.4)
WBC: 4.2 x10E3/uL (ref 3.4–10.8)

## 2023-09-04 LAB — B12 AND FOLATE PANEL
Folate: >20 ng/mL (ref >3.0)
Vitamin B-12: 872 pg/mL (ref 232–1245)

## 2023-09-04 LAB — VITAMIN D 25 HYDROXY (VIT D DEFICIENCY, FRACTURES): Vit D, 25-Hydroxy: 28.1 ng/mL — ABNORMAL LOW (ref 30.0–100.0)

## 2023-09-18 ENCOUNTER — Ambulatory Visit (INDEPENDENT_AMBULATORY_CARE_PROVIDER_SITE_OTHER): Admitting: Physician Assistant

## 2023-09-18 ENCOUNTER — Encounter: Payer: Self-pay | Admitting: Physician Assistant

## 2023-09-18 VITALS — BP 120/80 | HR 76 | Temp 97.8°F | Resp 16 | Ht 69.0 in | Wt 219.0 lb

## 2023-09-18 DIAGNOSIS — R200 Anesthesia of skin: Secondary | ICD-10-CM

## 2023-09-18 DIAGNOSIS — Z1211 Encounter for screening for malignant neoplasm of colon: Secondary | ICD-10-CM | POA: Diagnosis not present

## 2023-09-18 DIAGNOSIS — E559 Vitamin D deficiency, unspecified: Secondary | ICD-10-CM | POA: Diagnosis not present

## 2023-09-18 DIAGNOSIS — Z1212 Encounter for screening for malignant neoplasm of rectum: Secondary | ICD-10-CM

## 2023-09-18 DIAGNOSIS — N1831 Chronic kidney disease, stage 3a: Secondary | ICD-10-CM | POA: Diagnosis not present

## 2023-09-18 NOTE — Progress Notes (Signed)
 [facility] [address] [place], [place] [address]  Internal MEDICINE  Office Visit Note  Patient Name: Ricky Ross  [id number]  [id number]  Date of Service: 09/18/2023  Chief Complaint  Patient presents with   Follow-up    Labs    Quality Metric Gaps    colonoscopy    HPI Pt is here for routine follow up -Went to dentist and had some soreness along one tooth and was given a special toothpaste to help--Clinpro 5000 -Unfortunately he had a bad reaction to this and it caused some tongue swelling and numbness, a rash, and body aches -completely resolved since stopping the toothpaste. No more numbness -Labs reviewed: Vit D low and has started a gummy multivitamin, states he has not tolerated pill form vitamins previously  Current Medication: Outpatient Encounter Medications as of 09/18/2023  Medication Sig   Multiple Vitamin (MULTI-VITAMINS) TABS Take by mouth.   tamsulosin  (FLOMAX ) 0.4 MG CAPS capsule Take 1 capsule (0.4 mg total) by mouth daily.   No facility-administered encounter medications on file as of 09/18/2023.    Surgical History: Past Surgical History:  Procedure Laterality Date   CARDIAC CATHETERIZATION N/A 03/22/2015   Procedure: Right/Left Heart Cath and Coronary Angiography;  Surgeon: Wolm JINNY Rhyme, MD;  Location: [facility] INVASIVE CV LAB;  Service: Cardiovascular;  Laterality: N/A;   COLONOSCOPY WITH PROPOFOL  N/A 11/27/2018   Procedure: COLONOSCOPY WITH PROPOFOL ;  Surgeon: Janalyn Keene NOVAK, MD;  Location: [facility] ENDOSCOPY;  Service: Endoscopy;  Laterality: N/A;   MITRAL VALVE REPAIR     TEE WITHOUT CARDIOVERSION N/A 03/22/2015   Procedure: TRANSESOPHAGEAL ECHOCARDIOGRAM (TEE);  Surgeon: Wolm JINNY Rhyme, MD;  Location: [facility] ORS;  Service: Cardiovascular;  Laterality: N/A;    Medical History: Past Medical History:  Diagnosis Date   Heart murmur    Mitral valve disorder     Family History: Family History  Family history unknown: Yes     Social History   Socioeconomic History   Marital status: Married    Spouse name: Not on file   Number of children: Not on file   Years of education: Not on file   Highest education level: Not on file  Occupational History   Not on file  Tobacco Use   Smoking status: Never   Smokeless tobacco: Never  Vaping Use   Vaping status: Never Used  Substance and Sexual Activity   Alcohol use: No   Drug use: No   Sexual activity: Not on file  Other Topics Concern   Not on file  Social History Narrative   Not on file   Social Drivers of Health   Financial Resource Strain: Not on file  Food Insecurity: Not on file  Transportation Needs: Not on file  Physical Activity: Not on file  Stress: Not on file  Social Connections: Not on file  Intimate Partner Violence: Not on file      Review of Systems  Constitutional:  Negative for chills, fatigue and unexpected weight change.  HENT:  Negative for congestion, postnasal drip, rhinorrhea, sneezing and sore throat.   Eyes:  Negative for redness.  Respiratory:  Negative for cough, chest tightness and shortness of breath.   Cardiovascular:  Negative for chest pain and palpitations.  Gastrointestinal:  Negative for abdominal pain, constipation, diarrhea, nausea and vomiting.  Genitourinary:  Negative for dysuria.  Musculoskeletal:  Negative for arthralgias, back pain, joint swelling and neck pain.  Skin:  Negative for rash.  Neurological: Negative.  Negative for tremors  and numbness.  Hematological:  Negative for adenopathy. Does not bruise/bleed easily.  Psychiatric/Behavioral:  Negative for behavioral problems (Depression), sleep disturbance and suicidal ideas. The patient is not nervous/anxious.     Vital Signs: BP 120/80   Pulse 76   Temp 97.8 F (36.6 C)   Resp 16   Ht 5' 9 (1.753 m)   Wt 219 lb (99.3 kg)   SpO2 97%   BMI 32.34 kg/m    Physical Exam Vitals and nursing note reviewed.  Constitutional:      General:  He is not in acute distress.    Appearance: Normal appearance. He is obese. He is not ill-appearing.  HENT:     Head: Normocephalic and atraumatic.  Eyes:     Extraocular Movements: Extraocular movements intact.  Cardiovascular:     Rate and Rhythm: Normal rate and regular rhythm.  Pulmonary:     Effort: Pulmonary effort is normal.  Musculoskeletal:        General: Normal range of motion.     Cervical back: Normal range of motion.  Skin:    General: Skin is warm and dry.  Neurological:     Mental Status: He is alert and oriented to person, place, and time.  Psychiatric:        Mood and Affect: Mood normal.        Behavior: Behavior normal.        Assessment/Plan: 1. Numbness of tongue (Primary) Resolved upon stopping toothpaste  2. Vitamin D  deficiency Continue vitamin supplement  3. Screening for colorectal cancer - Ambulatory referral to Gastroenterology  4. Stage 3a chronic kidney disease (HCC) Improved on recent labs, continue to monitor   General Counseling: Heaton verbalizes understanding of the findings of todays visit and agrees with plan of treatment. I have discussed any further diagnostic evaluation that may be needed or ordered today. We also reviewed his medications today. he has been encouraged to call the office with any questions or concerns that should arise related to todays visit.    Orders Placed This Encounter  Procedures   Ambulatory referral to Gastroenterology    No orders of the defined types were placed in this encounter.   This patient was seen by Tinnie Pro, PA-C in collaboration with Dr. Sigrid Bathe as a part of collaborative care agreement.   Total time spent:30 Minutes Time spent includes review of chart, medications, test results, and follow up plan with the patient.      Dr Fozia M Khan Internal medicine

## 2023-09-19 ENCOUNTER — Telehealth: Payer: Self-pay | Admitting: Physician Assistant

## 2023-09-19 NOTE — Telephone Encounter (Signed)
 Gastroenterology referral sent via [facility] to [facility].  Notified patient. Gave telephone# [phone]-Toni

## 2023-10-17 ENCOUNTER — Telehealth: Payer: Self-pay | Admitting: Physician Assistant

## 2023-10-17 NOTE — Telephone Encounter (Signed)
 Gastroenterology appointment 02/22/2024 with [facility] -[place]

## 2023-11-30 ENCOUNTER — Encounter: Payer: Self-pay | Admitting: Emergency Medicine

## 2023-11-30 ENCOUNTER — Other Ambulatory Visit: Payer: Self-pay

## 2023-11-30 ENCOUNTER — Observation Stay
Admission: EM | Admit: 2023-11-30 | Discharge: 2023-12-01 | Disposition: A | Discharge status: Home / Self Care | Attending: Internal Medicine | Admitting: Internal Medicine

## 2023-11-30 ENCOUNTER — Emergency Department

## 2023-11-30 DIAGNOSIS — R569 Unspecified convulsions: Secondary | ICD-10-CM | POA: Diagnosis not present

## 2023-11-30 DIAGNOSIS — Y900 Blood alcohol level of less than 20 mg/100 ml: Secondary | ICD-10-CM | POA: Insufficient documentation

## 2023-11-30 DIAGNOSIS — N1831 Chronic kidney disease, stage 3a: Secondary | ICD-10-CM | POA: Diagnosis not present

## 2023-11-30 DIAGNOSIS — N179 Acute kidney failure, unspecified: Secondary | ICD-10-CM | POA: Diagnosis not present

## 2023-11-30 DIAGNOSIS — N189 Chronic kidney disease, unspecified: Secondary | ICD-10-CM

## 2023-11-30 DIAGNOSIS — Z7901 Long term (current) use of anticoagulants: Secondary | ICD-10-CM | POA: Insufficient documentation

## 2023-11-30 DIAGNOSIS — Z79899 Other long term (current) drug therapy: Secondary | ICD-10-CM | POA: Diagnosis not present

## 2023-11-30 DIAGNOSIS — N400 Enlarged prostate without lower urinary tract symptoms: Secondary | ICD-10-CM | POA: Diagnosis not present

## 2023-11-30 LAB — CBC WITH DIFFERENTIAL/PLATELET
Abs Immature Granulocytes: 0.03 K/uL (ref 0.00–0.07)
Basophils Absolute: 0 K/uL (ref 0.0–0.1)
Basophils Relative: 1 %
Eosinophils Absolute: 0 K/uL (ref 0.0–0.5)
Eosinophils Relative: 0 %
HCT: 44.4 % (ref 39.0–52.0)
Hemoglobin: 15 g/dL (ref 13.0–17.0)
Immature Granulocytes: 1 %
Lymphocytes Relative: 35 %
Lymphs Abs: 1.5 K/uL (ref 0.7–4.0)
MCH: 29.6 pg (ref 26.0–34.0)
MCHC: 33.8 g/dL (ref 30.0–36.0)
MCV: 87.7 fL (ref 80.0–100.0)
Monocytes Absolute: 0.5 K/uL (ref 0.1–1.0)
Monocytes Relative: 12 %
Neutro Abs: 2.2 K/uL (ref 1.7–7.7)
Neutrophils Relative %: 51 %
Platelets: 165 K/uL (ref 150–400)
RBC: 5.06 MIL/uL (ref 4.22–5.81)
RDW: 14.2 % (ref 11.5–15.5)
WBC: 4.3 K/uL (ref 4.0–10.5)
nRBC: 0 % (ref 0.0–0.2)

## 2023-11-30 LAB — COMPREHENSIVE METABOLIC PANEL WITH GFR
ALT: 10 U/L (ref 0–44)
AST: 33 U/L (ref 15–41)
Albumin: 3.6 g/dL (ref 3.5–5.0)
Alkaline Phosphatase: 32 U/L — ABNORMAL LOW (ref 38–126)
Anion gap: 10 (ref 5–15)
BUN: 15 mg/dL (ref 6–20)
CO2: 20 mmol/L — ABNORMAL LOW (ref 22–32)
Calcium: 8.4 mg/dL — ABNORMAL LOW (ref 8.9–10.3)
Chloride: 106 mmol/L (ref 98–111)
Creatinine, Ser: 1.6 mg/dL — ABNORMAL HIGH (ref 0.61–1.24)
GFR, Estimated: 51 mL/min — ABNORMAL LOW (ref >60)
Glucose, Bld: 101 mg/dL — ABNORMAL HIGH (ref 70–99)
Potassium: 4.5 mmol/L (ref 3.5–5.1)
Sodium: 136 mmol/L (ref 135–145)
Total Bilirubin: 1.1 mg/dL (ref 0.0–1.2)
Total Protein: 6.6 g/dL (ref 6.5–8.1)

## 2023-11-30 LAB — TROPONIN I (HIGH SENSITIVITY): Troponin I (High Sensitivity): 13 ng/L (ref <18)

## 2023-11-30 NOTE — ED Provider Notes (Signed)
 [facility] Provider Note    Event Date/Time   First MD Initiated Contact with Patient 11/30/23 1934     (approximate)   History   Seizures   HPI  Ricky Ross is a 53 y.o. male with history of CKD, renal lesion who comes in with concerns for seizure.  Patient was laying on the couch when wife found him jerking.  Patient noted to have bleeding around his mouth and was disoriented.  He denies this happening previously.  He denies any alcohol use or drug use.  He denies any continued symptoms.  Patient denies any new changes to medications he does report taking Flomax  but he is on that for over a month.  He denies any prodromal symptoms.  Physical Exam   Triage Vital Signs: ED Triage Vitals  Encounter Vitals Group     BP 11/30/23 1938 134/84     Girls Systolic BP Percentile --      Girls Diastolic BP Percentile --      Boys Systolic BP Percentile --      Boys Diastolic BP Percentile --      Pulse Rate 11/30/23 1938 76     Resp 11/30/23 1938 18     Temp 11/30/23 1938 98.1 F (36.7 C)     Temp Source 11/30/23 1938 Oral     SpO2 11/30/23 2030 98 %     Weight --      Height --      Head Circumference --      Peak Flow --      Pain Score 11/30/23 1934 0     Pain Loc --      Pain Education --      Exclude from Growth Chart --     Most recent vital signs: Vitals:   11/30/23 1938 11/30/23 2030  BP: 134/84 118/81  Pulse: 76 76  Resp: 18 19  Temp: 98.1 F (36.7 C)   SpO2:  98%     General: Awake, no distress.  CV:  Good peripheral perfusion.  Resp:  Normal effort.  Abd:  No distention.  Soft and nontender Other:  Cram nerves appear intact equal strength in arms and legs.  Finger-nose intact bilaterally.  His tongue does have an abrasion [place] but no laceration noted   ED Results / Procedures / Treatments   Labs (all labs ordered are listed, but only abnormal results are displayed) Labs Reviewed  COMPREHENSIVE METABOLIC PANEL WITH GFR  - Abnormal; Notable for the following components:      Result Value   CO2 20 (*)    Glucose, Bld 101 (*)    Creatinine, Ser 1.60 (*)    Calcium 8.4 (*)    Alkaline Phosphatase 32 (*)    GFR, Estimated 51 (*)    All other components within normal limits  CBC WITH DIFFERENTIAL/PLATELET  URINE DRUG SCREEN, QUALITATIVE ([facility] ONLY)  URINALYSIS, ROUTINE W REFLEX MICROSCOPIC  ETHANOL  CBG MONITORING, ED  TROPONIN I (HIGH SENSITIVITY)  TROPONIN I (HIGH SENSITIVITY)     EKG  My interpretation of EKG:  Normal sinus rate of 85 without any ST elevation or T wave inversions, normal intervals  RADIOLOGY I have reviewed the ct personally and interpreted no evidence of intracranial hemorrhage   PROCEDURES:  Critical Care performed: No  .1-3 Lead EKG Interpretation  Performed by: Ernest Ronal BRAVO, MD Authorized by: Ernest Ronal BRAVO, MD     Interpretation: normal  ECG rate:  70   ECG rate assessment: normal     Rhythm: sinus rhythm     Ectopy: none     Conduction: normal      MEDICATIONS ORDERED IN ED: Medications - No data to display   IMPRESSION / MDM / ASSESSMENT AND PLAN / ED COURSE  I reviewed the triage vital signs and the nursing notes.   Patient's presentation is most consistent with acute presentation with potential threat to life or bodily function.   Differential includes seizure, syncope, arrhythmia.  CT imaging ordered the head.  Given he did seem still little postictal I did get a CT of his cervical area but he really denied any pain in his neck.  CT head was negative and CT cervical however did show some spondylosis with compression of his cervical spine.  CBC reassuring.  CMP reassuring slightly elevated creatinine but similar to prior.  Troponin was negative.  11:18 PM repeat evaluation and patient has continued normal neurological examination he denies any neck pain and denies any neck chiropractor to suggest dissection.  This seems most consistent with  seizure versus potential syncopal episode versus arrhythmia.  Given patient's age and this has never happened before discussed with patient admission to the hospital for cardiac monitoring, MRI, neuroevaluation, possible EEG.  Family expressed understanding felt comfortable with this plan.  His CT cervical did note some compression of the cervical spine but patient denies any symptoms of this he denies any weakness in his hands, numbness.   The patient is on the cardiac monitor to evaluate for evidence of arrhythmia and/or significant heart rate changes.      FINAL CLINICAL IMPRESSION(S) / ED DIAGNOSES   Final diagnoses:  Seizure (HCC)     Rx / DC Orders   ED Discharge Orders     None        Note:  This document was prepared using Dragon voice recognition software and may include unintentional dictation errors.   Ernest Ronal BRAVO, MD 11/30/23 [phone]

## 2023-11-30 NOTE — ED Notes (Signed)
 CCMD called for cardiac monitoring.

## 2023-11-30 NOTE — ED Notes (Signed)
Tech assisted pt to bathroom.

## 2023-11-30 NOTE — ED Triage Notes (Addendum)
 Pt arrives w/ EMS w/ possible seizure. No hx of seizures. Wife found pt jerking on couch and called EMS. Pt does have bleeding around mouth  Disoriented upon EMS arrival. A&O x 4 at this time 20G LAC 97 CBG  All other VSS

## 2023-12-01 ENCOUNTER — Observation Stay

## 2023-12-01 DIAGNOSIS — N400 Enlarged prostate without lower urinary tract symptoms: Secondary | ICD-10-CM | POA: Insufficient documentation

## 2023-12-01 DIAGNOSIS — R569 Unspecified convulsions: Secondary | ICD-10-CM | POA: Diagnosis not present

## 2023-12-01 DIAGNOSIS — N179 Acute kidney failure, unspecified: Secondary | ICD-10-CM

## 2023-12-01 LAB — URINALYSIS, ROUTINE W REFLEX MICROSCOPIC
Bilirubin Urine: NEGATIVE
Glucose, UA: NEGATIVE mg/dL
Hgb urine dipstick: NEGATIVE
Ketones, ur: NEGATIVE mg/dL
Leukocytes,Ua: NEGATIVE
Nitrite: NEGATIVE
Protein, ur: NEGATIVE mg/dL
Specific Gravity, Urine: 1.005 (ref 1.005–1.030)
pH: 6 (ref 5.0–8.0)

## 2023-12-01 LAB — URINE DRUG SCREEN, QUALITATIVE (ARMC ONLY)
Amphetamines, Ur Screen: NOT DETECTED
Barbiturates, Ur Screen: NOT DETECTED
Benzodiazepine, Ur Scrn: NOT DETECTED
Cannabinoid 50 Ng, Ur ~~LOC~~: NOT DETECTED
Cocaine Metabolite,Ur ~~LOC~~: NOT DETECTED
MDMA (Ecstasy)Ur Screen: NOT DETECTED
Methadone Scn, Ur: NOT DETECTED
Opiate, Ur Screen: NOT DETECTED
Phencyclidine (PCP) Ur S: NOT DETECTED
Tricyclic, Ur Screen: NOT DETECTED

## 2023-12-01 LAB — TROPONIN I (HIGH SENSITIVITY): Troponin I (High Sensitivity): 24 ng/L — ABNORMAL HIGH (ref <18)

## 2023-12-01 LAB — HIV ANTIBODY (ROUTINE TESTING W REFLEX): HIV Screen 4th Generation wRfx: NONREACTIVE

## 2023-12-01 LAB — ETHANOL: Alcohol, Ethyl (B): <15 mg/dL (ref <15)

## 2023-12-01 MED ORDER — LORAZEPAM 2 MG/ML IJ SOLN: 1.0000 mg | INTRAMUSCULAR | Status: DC | PRN

## 2023-12-01 MED ORDER — TAMSULOSIN HCL 0.4 MG PO CAPS: 0.4000 mg | ORAL_CAPSULE | Freq: Every day | ORAL | Status: DC

## 2023-12-01 MED ORDER — ENOXAPARIN SODIUM 60 MG/0.6ML IJ SOSY: 50.0000 mg | PREFILLED_SYRINGE | INTRAMUSCULAR | Status: DC

## 2023-12-01 MED ORDER — TRAZODONE HCL 50 MG PO TABS: 25.0000 mg | ORAL_TABLET | Freq: Every evening | ORAL | Status: DC | PRN

## 2023-12-01 MED ORDER — LEVETIRACETAM 500 MG PO TABS: 500.0000 mg | ORAL_TABLET | Freq: Two times a day (BID) | ORAL | 1 refills | Status: AC

## 2023-12-01 MED ORDER — MAGNESIUM HYDROXIDE 400 MG/5ML PO SUSP: 30.0000 mL | Freq: Every day | ORAL | Status: DC | PRN

## 2023-12-01 MED ORDER — SODIUM CHLORIDE 0.9 % IV SOLN
INTRAVENOUS | Status: DC
Start: 2023-12-01 — End: 2023-12-01

## 2023-12-01 MED ORDER — ADULT MULTIVITAMIN W/MINERALS CH: 1.0000 | ORAL_TABLET | Freq: Every day | ORAL | Status: DC

## 2023-12-01 MED ORDER — LEVETIRACETAM (KEPPRA) 500 MG/5 ML ADULT IV PUSH
20.0000 mg/kg | Freq: Once | INTRAVENOUS | Status: AC
  Administered 2023-12-01: 2000 mg via INTRAVENOUS
  Filled 2023-12-01 (×2): qty 20

## 2023-12-01 MED ORDER — ONDANSETRON HCL 4 MG/2ML IJ SOLN: 4.0000 mg | Freq: Four times a day (QID) | INTRAMUSCULAR | Status: DC | PRN

## 2023-12-01 MED ORDER — ACETAMINOPHEN 650 MG RE SUPP: 650.0000 mg | Freq: Four times a day (QID) | RECTAL | Status: DC | PRN

## 2023-12-01 MED ORDER — ACETAMINOPHEN 325 MG PO TABS: 650.0000 mg | ORAL_TABLET | Freq: Four times a day (QID) | ORAL | Status: DC | PRN

## 2023-12-01 MED ORDER — ONDANSETRON HCL 4 MG PO TABS: 4.0000 mg | ORAL_TABLET | Freq: Four times a day (QID) | ORAL | Status: DC | PRN

## 2023-12-01 MED ORDER — GADOBUTROL 1 MMOL/ML IV SOLN
10.0000 mL | Freq: Once | INTRAVENOUS | Status: AC | PRN
  Administered 2023-12-01: 10 mL via INTRAVENOUS

## 2023-12-01 NOTE — Progress Notes (Signed)
 Eeg done

## 2023-12-01 NOTE — Progress Notes (Signed)
 Keppra IV push ordered for pt just before d/c.  Keppra push given over 5 minutes, then right after pt started to have some confusion and delayed responses.  Was unable to state location and situation.  Fausto, DO notified.  Episode passed within 5 minutes but he was still having delayed responses, and while he was able to state time and location, it took pt several seconds to come up with the word.  Pt currently completely A&Ox4 and is completely back to baseline that was prior to episode.  Neurology was added to conversation by Dr. Fausto, currently awaiting response.

## 2023-12-01 NOTE — Progress Notes (Signed)
 Per Dr Fausto, ok to dc patient home at this time

## 2023-12-01 NOTE — Progress Notes (Signed)
 TOC Brief Assessment:  Patient is from home and was admitted for seizure w/u. Neurology following. No TOC needs at this time. Please outreach if needs are identified.

## 2023-12-01 NOTE — Plan of Care (Signed)
 Pt transferred from ED; alert & oriented to room; call light within reach; bed padded/ lowest position.  Problem: Clinical Measurements: Goal: Ability to maintain clinical measurements within normal limits will improve Outcome: Progressing   Problem: Coping: Goal: Level of anxiety will decrease Outcome: Progressing   Problem: Elimination: Goal: Will not experience complications related to bowel motility Outcome: Progressing   Problem: Pain Managment: Goal: General experience of comfort will improve and/or be controlled Outcome: Progressing   Problem: Safety: Goal: Ability to remain free from injury will improve Outcome: Progressing

## 2023-12-01 NOTE — H&P (Addendum)
 [place]   PATIENT NAME: Ricky Ross    MR#:  [id number]  DATE OF BIRTH:  03-16-1970  DATE OF ADMISSION:  11/30/2023  PRIMARY CARE PHYSICIAN: Kristina Tinnie POUR, PA-C   Patient is coming from: Home  REQUESTING/REFERRING PHYSICIAN: Ernest Shuck, MD  CHIEF COMPLAINT:   Chief Complaint  Patient presents with   Seizures    HISTORY OF PRESENT ILLNESS:  Ricky Ross is a 53 y.o. African-American male with medical history significant for CKD, BPH, and mitral valve disease status post repair, who presented to the emergency room with acute onset of suspected seizure.  The patient was lying on his sofa when his wife found him jerking.  He was noted to have bleeding around his mouth and was disoriented.  He did not have any previous incidence similar to 2 days.  No history of alcohol abuse or illicit drug use.  No recent changes in medications.  No fever or chills.  No nausea or vomiting or abdominal pain.  No dysuria, oliguria or hematuria or flank pain.  No paresthesias or focal muscle weakness.  ED Course: When the patient came to the ER, vital signs were within normal.  Labs revealed a creatinine of 1.6 with a CO2 of 20 and otherwise unremarkable CMP.  High sensitive troponin I was 13 and later 24 and CBC was within normal. EKG as reviewed by me : Showed sinus rhythm with a rate of 85 with prolonged PR interval and biatrial enlargement with poor R wave progression and J-point elevation inferiorly with mild ST segment elevation. Imaging: Noncontrast head CT and C-spine scan revealed no acute intracranial normalities. Brain MRI was ordered and showed the following: 1. No acute intracranial abnormality. 2. Minor chronic microvascular ischemic disease for age. 3. Few scattered chronic micro hemorrhages, likely hypertensive in nature.  The patient will be admitted to an observation medical telemetry bed for further evaluation and management.   PAST MEDICAL HISTORY:   Past Medical  History:  Diagnosis Date   Heart murmur    Mitral valve disorder   CKD BPH  PAST SURGICAL HISTORY:   Past Surgical History:  Procedure Laterality Date   CARDIAC CATHETERIZATION N/A 03/22/2015   Procedure: Right/Left Heart Cath and Coronary Angiography;  Surgeon: Wolm JINNY Rhyme, MD;  Location: [facility] INVASIVE CV LAB;  Service: Cardiovascular;  Laterality: N/A;   COLONOSCOPY WITH PROPOFOL  N/A 11/27/2018   Procedure: COLONOSCOPY WITH PROPOFOL ;  Surgeon: Janalyn Keene NOVAK, MD;  Location: [facility] ENDOSCOPY;  Service: Endoscopy;  Laterality: N/A;   MITRAL VALVE REPAIR     TEE WITHOUT CARDIOVERSION N/A 03/22/2015   Procedure: TRANSESOPHAGEAL ECHOCARDIOGRAM (TEE);  Surgeon: Wolm JINNY Rhyme, MD;  Location: [facility] ORS;  Service: Cardiovascular;  Laterality: N/A;    SOCIAL HISTORY:   Social History   Tobacco Use   Smoking status: Never   Smokeless tobacco: Never  Substance Use Topics   Alcohol use: No    FAMILY HISTORY:   Family History  Family history unknown: Yes    DRUG ALLERGIES:   Allergies  Allergen Reactions   Milk (Cow) Hives    Whole   Clinpro 5000 [Sodium Fluoride] Swelling    Tongue swelling and numbness, rash, body ache   Milk-Related Compounds Hives    REVIEW OF SYSTEMS:   ROS As per history of present illness. All pertinent systems were reviewed above. Constitutional, HEENT, cardiovascular, respiratory, GI, GU, musculoskeletal, neuro, psychiatric, endocrine, integumentary and hematologic systems were reviewed and are otherwise negative/unremarkable  except for positive findings mentioned above in the HPI.   MEDICATIONS AT HOME:   Prior to Admission medications   Medication Sig Start Date End Date Taking? Authorizing Provider  Multiple Vitamin (MULTI-VITAMINS) TABS Take 1 tablet by mouth daily.   Yes [provider]  tamsulosin  (FLOMAX ) 0.4 MG CAPS capsule Take 1 capsule (0.4 mg total) by mouth daily. Patient taking differently: Take 0.4 mg by mouth  daily after supper. 04/02/23  Yes Francisca Redell BROCKS, MD      VITAL SIGNS:  Blood pressure 115/76, pulse 68, temperature 97.6 F (36.4 C), temperature source Oral, resp. rate 20, height 5' 9 (1.753 m), weight 97.3 kg, SpO2 98%.  PHYSICAL EXAMINATION:  Physical Exam  GENERAL:  53 y.o.-year-old patient lying in the bed with no acute distress.  EYES: Pupils equal, round, reactive to light and accommodation. No scleral icterus. Extraocular muscles intact.  HEENT: Head atraumatic, normocephalic. Oropharynx showed tongue abrasion without laceration and nasopharynx clear.  NECK:  Supple, no jugular venous distention. No thyroid enlargement, no tenderness.  LUNGS: Normal breath sounds bilaterally, no wheezing, rales,rhonchi or crepitation. No use of accessory muscles of respiration.  CARDIOVASCULAR: Regular rate and rhythm, S1, S2 normal. No murmurs, rubs, or gallops.  ABDOMEN: Soft, nondistended, nontender. Bowel sounds present. No organomegaly or mass.  EXTREMITIES: No pedal edema, cyanosis, or clubbing.  NEUROLOGIC: Cranial nerves II through XII are intact. Muscle strength 5/5 in all extremities. Sensation intact. Gait not checked.  PSYCHIATRIC: The patient is alert and oriented x 3.  Normal affect and good eye contact. SKIN: No obvious rash, lesion, or ulcer.   LABORATORY PANEL:   CBC Recent Labs  Lab 11/30/23 2028  WBC 4.3  HGB 15.0  HCT 44.4  PLT 165   ------------------------------------------------------------------------------------------------------------------  Chemistries  Recent Labs  Lab 11/30/23 2028  NA 136  K 4.5  CL 106  CO2 20*  GLUCOSE 101*  BUN 15  CREATININE 1.60*  CALCIUM 8.4*  AST 33  ALT 10  ALKPHOS 32*  BILITOT 1.1   ------------------------------------------------------------------------------------------------------------------  Cardiac Enzymes No results for input(s): TROPONINI in the last 168  hours. ------------------------------------------------------------------------------------------------------------------  RADIOLOGY:  MR Cervical Spine Wo Contrast Result Date: 12/01/2023 CLINICAL DATA:  Initial evaluation for bone lesion, possible seizure. EXAM: MRI CERVICAL SPINE WITHOUT CONTRAST TECHNIQUE: Multiplanar, multisequence MR imaging of the cervical spine was performed. No intravenous contrast was administered. COMPARISON:  Prior CT from 11/30/2023. FINDINGS: Alignment: Smooth reversal of the normal cervical lordosis, apex at C5. No listhesis. Vertebrae: Vertebral body height maintained without acute or chronic fracture. Decreased T1 signal intensity within the visualized bone marrow, nonspecific, but most commonly related to anemia, smoking, or obesity. No discrete or worrisome osseous lesions. Mild degenerative reactive endplate change present about the C4-5 through C6-7 interspaces. No other abnormal marrow edema. Cord: Normal signal and morphology. Posterior Fossa, vertebral arteries, paraspinal tissues: Unremarkable. Disc levels: C2-C3: Small central disc protrusion indents the ventral thecal sac (series 8, image 4). Mild bilateral facet hypertrophy. No canal or foraminal stenosis. C3-C4: Right paracentral disc osteophyte complex flattens and effaces the ventral thecal sac. Mild cord flattening without cord signal changes. Mild to moderate spinal stenosis. Uncovertebral spurring with mild right C4 foraminal narrowing. Left neural foramina remains patent. C4-C5: Mild intervertebral disc space narrowing. Right paracentral disc osteophyte complex indents the right ventral thecal sac, contacting and flattening the right hemicord (series 8, image 14). No cord signal changes. Moderate spinal stenosis. Uncovertebral spurring with mild right C5 foraminal stenosis. Left  neural foramina remains patent. C5-C6: Degenerative intervertebral disc space narrowing. Right paracentral disc osteophyte complex  indents the right ventral thecal sac, contacting and flattening the right hemicord (series 8, image 19). No cord signal changes. Mild to moderate spinal stenosis. Bilateral uncovertebral spurring with resultant moderate right and mild left C6 foraminal stenosis. C6-C7: Mild intervertebral disc space narrowing with diffuse disc osteophyte complex. Flattening of the ventral thecal sac without significant spinal stenosis. Mild right C7 foraminal narrowing. Left neural foramen remains patent. C7-T1: Minimal disc bulge. Mild bilateral facet hypertrophy. No spinal stenosis. Foramina remain patent. IMPRESSION: 1. No acute abnormality within the cervical spine. 2. Multilevel cervical spondylosis with resultant mild to moderate diffuse spinal stenosis at C3-4 through C5-6. 3. Multifactorial degenerative changes with resultant multilevel foraminal narrowing as above. Notable findings include mild right C4 and C5 foraminal stenosis, moderate right with mild left C6 foraminal narrowing, with mild right C7 foraminal stenosis. Electronically Signed   By: Morene Hoard M.D.   On: 12/01/2023 02:10   MR Brain W and Wo Contrast Result Date: 12/01/2023 CLINICAL DATA:  Initial evaluation for new onset seizure. EXAM: MRI HEAD WITHOUT AND WITH CONTRAST TECHNIQUE: Multiplanar, multiecho pulse sequences of the brain and surrounding structures were obtained without and with intravenous contrast. CONTRAST:  10mL GADAVIST GADOBUTROL 1 MMOL/ML IV SOLN COMPARISON:  CT from 11/30/2023. FINDINGS: Brain: Cerebral volume within normal limits. Minimal hazy T2/FLAIR signal abnormality seen involving the periventricular deep white matter both cerebral hemispheres, most like related to chronic microvascular ischemic disease. Changes are minor for age. No evidence for acute or subacute infarct. No areas of chronic cortical infarction. No acute intracranial hemorrhage. Few scattered chronic micro hemorrhages noted, likely hypertensive in  nature. Small benign lipoma noted at the right quadrigeminal plate cistern. No other mass lesion, midline shift or mass effect. No hydrocephalus or extra-axial fluid collection. Pituitary gland and suprasellar region within normal limits. No intrinsic temporal lobe abnormality. No abnormal enhancement. Vascular: Major intracranial vascular flow voids are maintained. Skull and upper cervical spine: Craniocervical junction within normal limits. Decreased T1 signal intensity within the bone marrow the visualized upper cervical spine, nonspecific, but most commonly related to anemia, smoking or obesity. No scalp soft tissue abnormality. Sinuses/Orbits: Prior bilateral ocular lens replacement. Mild scattered mucosal thickening present about the ethmoidal air cells. No significant mastoid effusion. Other: None. IMPRESSION: 1. No acute intracranial abnormality. 2. Minor chronic microvascular ischemic disease for age. 3. Few scattered chronic micro hemorrhages, likely hypertensive in nature. Electronically Signed   By: Morene Hoard M.D.   On: 12/01/2023 01:54   CT HEAD WO CONTRAST ( ) Result Date: 11/30/2023 EXAM: CT HEAD AND CERVICAL SPINE 11/30/2023 07:56:00 PM TECHNIQUE: CT of the head and cervical spine was performed without the administration of intravenous contrast. Multiplanar reformatted images are provided for review. Automated exposure control, iterative reconstruction, and/or weight based adjustment of the mA/kV was utilized to reduce the radiation dose to as low as reasonably achievable. COMPARISON: None available. CLINICAL HISTORY: Ataxia, cervical trauma, head trauma, abnormal mental status, history of seizures. FINDINGS: CT HEAD BRAIN AND VENTRICLES: There are early changes of cerebral atrophy and small vessel disease. The cerebellum and brainstem are unremarkable. No cortical-based acute infarct, hemorrhage, or mass effect noted. No abnormal extra-axial fluid collection. The ventricles are  normal in size and position. There are no hyperdense vessels or unexpected vascular calcification. ORBITS: No acute abnormality. SINUSES AND MASTOIDS: No acute abnormality. SOFT TISSUES AND SKULL: There are cutaneous calcifications in the  frontotemporal scalp. No acute skull fracture. CT CERVICAL SPINE BONES AND ALIGNMENT: There is a straightened cervical lordosis. No listhesis is seen, and no widening of the anterior atlantodental joint. No acute fracture. DEGENERATIVE CHANGES: The discs are degenerated with bidirectional endplate osteophytes from C3-C4 through C6-C7. Posterior osteophytes are causing spinal stenosis and mild cord compression eccentric to the right at these levels. The C2-C3 and C7-T1 discs are normal in height. There are multilevel mild facet joint and uncinate spurring changes. Moderate foraminal stenosis is present at C5-C6. At C7-T1, there is moderate to severe right and moderate left foraminal stenosis. The other foramina are patent. SOFT TISSUES: No prevertebral soft tissue swelling. LUNGS: There are mild paraseptal emphysematous changes in the right lung apex. IMPRESSION: 1. No acute intracranial CT findings or depressed skull fractures. 2. Early cerebral atrophy and small vessel disease. 3. Cervical spondylosis with posterior osteophytes causing mild cord compression at C3-C4 through C6-C7. 4. there is moderate foraminal stenosis at C5-C6, and moderate to severe right and moderate left foraminal stenosis at C7-T1. 5. Mild paraseptal emphysema at the right lung apex. Electronically signed by: Francis Quam MD 11/30/2023 08:15 PM EST RP Workstation: [id number]   CT Cervical Spine Wo Contrast Result Date: 11/30/2023 EXAM: CT HEAD AND CERVICAL SPINE 11/30/2023 07:56:00 PM TECHNIQUE: CT of the head and cervical spine was performed without the administration of intravenous contrast. Multiplanar reformatted images are provided for review. Automated exposure control, iterative reconstruction,  and/or weight based adjustment of the mA/kV was utilized to reduce the radiation dose to as low as reasonably achievable. COMPARISON: None available. CLINICAL HISTORY: Ataxia, cervical trauma, head trauma, abnormal mental status, history of seizures. FINDINGS: CT HEAD BRAIN AND VENTRICLES: There are early changes of cerebral atrophy and small vessel disease. The cerebellum and brainstem are unremarkable. No cortical-based acute infarct, hemorrhage, or mass effect noted. No abnormal extra-axial fluid collection. The ventricles are normal in size and position. There are no hyperdense vessels or unexpected vascular calcification. ORBITS: No acute abnormality. SINUSES AND MASTOIDS: No acute abnormality. SOFT TISSUES AND SKULL: There are cutaneous calcifications in the frontotemporal scalp. No acute skull fracture. CT CERVICAL SPINE BONES AND ALIGNMENT: There is a straightened cervical lordosis. No listhesis is seen, and no widening of the anterior atlantodental joint. No acute fracture. DEGENERATIVE CHANGES: The discs are degenerated with bidirectional endplate osteophytes from C3-C4 through C6-C7. Posterior osteophytes are causing spinal stenosis and mild cord compression eccentric to the right at these levels. The C2-C3 and C7-T1 discs are normal in height. There are multilevel mild facet joint and uncinate spurring changes. Moderate foraminal stenosis is present at C5-C6. At C7-T1, there is moderate to severe right and moderate left foraminal stenosis. The other foramina are patent. SOFT TISSUES: No prevertebral soft tissue swelling. LUNGS: There are mild paraseptal emphysematous changes in the right lung apex. IMPRESSION: 1. No acute intracranial CT findings or depressed skull fractures. 2. Early cerebral atrophy and small vessel disease. 3. Cervical spondylosis with posterior osteophytes causing mild cord compression at C3-C4 through C6-C7. 4. there is moderate foraminal stenosis at C5-C6, and moderate to severe  right and moderate left foraminal stenosis at C7-T1. 5. Mild paraseptal emphysema at the right lung apex. Electronically signed by: Francis Quam MD 11/30/2023 08:15 PM EST RP Workstation: [id number]      IMPRESSION AND PLAN:  Assessment and Plan: * Seizure [facility]) - The patient will be admitted to a medical telemetry observation bed. - May be placed on seizure precautions. - As needed  IV Ativan will be provided. - EEG and neurology consult will be obtained. - I notified Dr. Matthews about the patient - Will follow neurochecks every 4 hours for 24 hours per  Acute kidney injury superimposed on chronic kidney disease - This is likely prerenal. - Will hydrate the patient with IV normal saline and follow BMP. - Will follow BMP and avoid nephrotoxins.  BPH (benign prostatic hyperplasia) - Will continue Flomax .   DVT prophylaxis: SCDs.  We are holding off Lovenox given his brain MRI findings. Advanced Care Planning:  Code Status: full code.  Family Communication:  The plan of care was discussed in details with the patient (and family). I answered all questions. The patient agreed to proceed with the above mentioned plan. Further management will depend upon hospital course. Disposition Plan: Back to previous home environment Consults called: none.  All the records are reviewed and case discussed with ED provider.  Status is: Observation  I certify that at the time of admission, it is my clinical judgment that the patient will require hospital care extending less than 2 midnights.                            Dispo: The patient is from: Home              Anticipated d/c is to: Home              Patient currently is not medically stable to d/c.              Difficult to place patient: No  [place]ison DELENA Peaches M.D on 12/01/2023 at 6:59 AM  [facility]   From 7 PM-7 AM, contact night-coverage [email or web address]  CC: Primary care physician; Kristina Tinnie POUR, PA-C

## 2023-12-01 NOTE — Assessment & Plan Note (Signed)
Will continue Flomax.

## 2023-12-01 NOTE — Assessment & Plan Note (Addendum)
-   The patient will be admitted to a medical telemetry observation bed. - May be placed on seizure precautions. - As needed IV Ativan will be provided. - EEG and neurology consult will be obtained. - I notified Dr. Matthews about the patient - Will follow neurochecks every 4 hours for 24 hours per

## 2023-12-01 NOTE — Progress Notes (Signed)
 PHARMACIST - PHYSICIAN COMMUNICATION  CONCERNING:  Enoxaparin (Lovenox) for DVT Prophylaxis    RECOMMENDATION: Patient was prescribed enoxaprin 40mg  q24 hours for VTE prophylaxis.   Filed Weights   12/01/23 0147  Weight: 97.3 kg (214 lb 8.1 oz)    Body mass index is 31.68 kg/m.  Estimated Creatinine Clearance: 60 mL/min (A) (by C-G formula based on SCr of 1.6 mg/dL (H)).   Based on [facility] policy patient is candidate for enoxaparin 0.5mg /kg TBW SQ every 24 hours based on BMI being >30.  DESCRIPTION: Pharmacy has adjusted enoxaparin dose per [facility] policy.  Patient is now receiving enoxaparin 0.5 mg/kg every 24 hours   [place] CANDIE Dills, PharmD, [facility] 12/01/2023 5:03 AM

## 2023-12-01 NOTE — Discharge Summary (Signed)
 Physician Discharge Summary   Patient: Ricky Ross MRN: [id number] DOB: March 20, 1970  Admit date:     11/30/2023  Discharge date: 12/01/2023  Discharge Physician: Burnard DELENA Cunning   PCP: Kristina Tinnie POUR, PA-C   Recommendations at discharge:    Follow up with Neurology Follow up with Primary Care Repeat CBC, CMP at follow up Follow up on efficacy and tolerance of Keppra  Discharge Diagnoses: Principal Problem:   Seizure (HCC) Active Problems:   Acute kidney injury superimposed on chronic kidney disease   BPH (benign prostatic hyperplasia)  Resolved Problems:   * No resolved hospital problems. *  Hospital Course:  [name] is a 53 y.o. African-American male with medical history significant for CKD, BPH, and mitral valve disease status post repair, who presented to the emergency room with acute onset of suspected seizure.  The patient was lying on his sofa when his wife found him jerking.  He was noted to have bleeding around his mouth and was disoriented.  He did not have any previous incidence similar to 2 days.  No history of alcohol abuse or illicit drug use.  No recent changes in medications.  No fever or chills.  No nausea or vomiting or abdominal pain.  No dysuria, oliguria or hematuria or flank pain.  No paresthesias or focal muscle weakness.   ED Course: When the patient came to the ER, vital signs were within normal.  Labs revealed a creatinine of 1.6 with a CO2 of 20 and otherwise unremarkable CMP.  High sensitive troponin I was 13 and later 24 and CBC was within normal. EKG as reviewed by me : Showed sinus rhythm with a rate of 85 with prolonged PR interval and biatrial enlargement with poor R wave progression and J-point elevation inferiorly with mild ST segment elevation. Imaging: Noncontrast head CT and C-spine scan revealed no acute intracranial normalities. Brain MRI was ordered and showed the following: 1. No acute intracranial abnormality. 2. Minor chronic  microvascular ischemic disease for age. 3. Few scattered chronic micro hemorrhages, likely hypertensive in nature.   The patient will be admitted to an observation medical telemetry bed for further evaluation and management.     Patient had evaluation for seizure, including EEG that was abnormal - showing rare interictal discharges over the left frontotemporal region, which increases epileptogenic potential in that area.  Pt was loaded with IV Keppra, and being discharged on PO Keppra.  Pt was given seizure precaution instructions by Neuology.   I reiterated to patient that he is not to drive, and provided a work note stating the same.  Patient is medically stable, cleared by Neurology and requesting discharge home.   Assessment and Plan:   Seizure (HCC) Management as above --Started on Keppra --Outpatient Neurology referral to [facility] sent --Follow up with Neurology --No driving, strict seizure precautions  Chronic kidney disease stage 3a AKI - ruled out. Unknown baseline renal function Cr on admission 1.60.  Given IV fluids. Baseline Cr appears 1.4--1.5 --BMP at follow up  BPH (benign prostatic hyperplasia) - Continue Flomax .       Consultants: Neurology Procedures performed: EEG  Disposition: Home Diet recommendation:  Discharge Diet Orders (From admission, onward)     Start     Ordered   12/01/23 0000  Diet - low sodium heart healthy        12/01/23 1549           Regular diet DISCHARGE MEDICATION: Allergies as of 12/01/2023  Reactions   Milk (cow) Hives   Whole   Clinpro 5000 [sodium Fluoride] Swelling   Tongue swelling and numbness, rash, body ache   Milk-related Compounds Hives        Medication List     TAKE these medications    levETIRAcetam 500 MG tablet Commonly known as: Keppra Take 1 tablet (500 mg total) by mouth 2 (two) times daily.   Multi-Vitamins Tabs Take 1 tablet by mouth daily.   tamsulosin  0.4 MG Caps  capsule Commonly known as: FLOMAX  Take 1 capsule (0.4 mg total) by mouth daily. What changed: when to take this        Discharge Exam: Filed Weights   12/01/23 0147  Weight: 97.3 kg   General exam: awake, alert, no acute distress HEENT: atraumatic, clear conjunctiva, anicteric sclera, moist mucus membranes, hearing grossly normal  Respiratory system: CTAB, no wheezes, rales or rhonchi, normal respiratory effort. Cardiovascular system: normal S1/S2, RRR, no JVD, murmurs, rubs, gallops, no pedal edema.   Gastrointestinal system: soft, NT, ND, no HSM felt, +bowel sounds. Central nervous system: A&O x3. no gross focal neurologic deficits, normal speech Extremities: moves all , no edema, normal tone Skin: dry, intact, normal temperature, normal color, No rashes, lesions or ulcers Psychiatry: normal mood, congruent affect, judgement and insight appear normal   Condition at discharge: stable  The results of significant diagnostics from this hospitalization (including imaging, microbiology, ancillary and laboratory) are listed below for reference.   Imaging Studies: EEG adult Result Date: 12/01/2023 Matthews Elida HERO, MD     12/01/2023  3:09 PM Routine EEG Report [name] is a 53 y.o. male with a history of seizure who is undergoing an EEG to evaluate for seizures. Report: This EEG was acquired with electrodes placed according to the International 10-20 electrode system (including Fp1, Fp2, F3, F4, C3, C4, P3, P4, O1, O2, T3, T4, T5, T6, A1, A2, Fz, Cz, Pz). The following electrodes were missing or displaced: none. The occipital dominant rhythm was 9 Hz. This activity is reactive to stimulation. Drowsiness was manifested by background fragmentation; deeper stages of sleep were identified by K complexes and sleep spindles. There was no focal slowing. There were rare sharp waves over the left frontotemporal region. There were no electrographic seizures identified. There was no abnormal  response to photic stimulation or hyperventilation. Impression and clinical correlation: This EEG was obtained while awake and asleep and is abnormal due to rare interictal discharges over the left frontotemporal region indicating increased epileptogenic potential in this area. Elida Matthews, MD [facility] [phone] If 7pm- 7am, please page neurology on call as listed in AMION.   MR Cervical Spine Wo Contrast Result Date: 12/01/2023 CLINICAL DATA:  Initial evaluation for bone lesion, possible seizure. EXAM: MRI CERVICAL SPINE WITHOUT CONTRAST TECHNIQUE: Multiplanar, multisequence MR imaging of the cervical spine was performed. No intravenous contrast was administered. COMPARISON:  Prior CT from 11/30/2023. FINDINGS: Alignment: Smooth reversal of the normal cervical lordosis, apex at C5. No listhesis. Vertebrae: Vertebral body height maintained without acute or chronic fracture. Decreased T1 signal intensity within the visualized bone marrow, nonspecific, but most commonly related to anemia, smoking, or obesity. No discrete or worrisome osseous lesions. Mild degenerative reactive endplate change present about the C4-5 through C6-7 interspaces. No other abnormal marrow edema. Cord: Normal signal and morphology. Posterior Fossa, vertebral arteries, paraspinal tissues: Unremarkable. Disc levels: C2-C3: Small central disc protrusion indents the ventral thecal sac (series 8, image 4). Mild bilateral facet hypertrophy. No canal  or foraminal stenosis. C3-C4: Right paracentral disc osteophyte complex flattens and effaces the ventral thecal sac. Mild cord flattening without cord signal changes. Mild to moderate spinal stenosis. Uncovertebral spurring with mild right C4 foraminal narrowing. Left neural foramina remains patent. C4-C5: Mild intervertebral disc space narrowing. Right paracentral disc osteophyte complex indents the right ventral thecal sac, contacting and flattening the right hemicord (series 8,  image 14). No cord signal changes. Moderate spinal stenosis. Uncovertebral spurring with mild right C5 foraminal stenosis. Left neural foramina remains patent. C5-C6: Degenerative intervertebral disc space narrowing. Right paracentral disc osteophyte complex indents the right ventral thecal sac, contacting and flattening the right hemicord (series 8, image 19). No cord signal changes. Mild to moderate spinal stenosis. Bilateral uncovertebral spurring with resultant moderate right and mild left C6 foraminal stenosis. C6-C7: Mild intervertebral disc space narrowing with diffuse disc osteophyte complex. Flattening of the ventral thecal sac without significant spinal stenosis. Mild right C7 foraminal narrowing. Left neural foramen remains patent. C7-T1: Minimal disc bulge. Mild bilateral facet hypertrophy. No spinal stenosis. Foramina remain patent. IMPRESSION: 1. No acute abnormality within the cervical spine. 2. Multilevel cervical spondylosis with resultant mild to moderate diffuse spinal stenosis at C3-4 through C5-6. 3. Multifactorial degenerative changes with resultant multilevel foraminal narrowing as above. Notable findings include mild right C4 and C5 foraminal stenosis, moderate right with mild left C6 foraminal narrowing, with mild right C7 foraminal stenosis. Electronically Signed   By: Morene Hoard M.D.   On: 12/01/2023 02:10   MR Brain W and Wo Contrast Result Date: 12/01/2023 CLINICAL DATA:  Initial evaluation for new onset seizure. EXAM: MRI HEAD WITHOUT AND WITH CONTRAST TECHNIQUE: Multiplanar, multiecho pulse sequences of the brain and surrounding structures were obtained without and with intravenous contrast. CONTRAST:  10mL GADAVIST GADOBUTROL 1 MMOL/ML IV SOLN COMPARISON:  CT from 11/30/2023. FINDINGS: Brain: Cerebral volume within normal limits. Minimal hazy T2/FLAIR signal abnormality seen involving the periventricular deep white matter both cerebral hemispheres, most like related to  chronic microvascular ischemic disease. Changes are minor for age. No evidence for acute or subacute infarct. No areas of chronic cortical infarction. No acute intracranial hemorrhage. Few scattered chronic micro hemorrhages noted, likely hypertensive in nature. Small benign lipoma noted at the right quadrigeminal plate cistern. No other mass lesion, midline shift or mass effect. No hydrocephalus or extra-axial fluid collection. Pituitary gland and suprasellar region within normal limits. No intrinsic temporal lobe abnormality. No abnormal enhancement. Vascular: Major intracranial vascular flow voids are maintained. Skull and upper cervical spine: Craniocervical junction within normal limits. Decreased T1 signal intensity within the bone marrow the visualized upper cervical spine, nonspecific, but most commonly related to anemia, smoking or obesity. No scalp soft tissue abnormality. Sinuses/Orbits: Prior bilateral ocular lens replacement. Mild scattered mucosal thickening present about the ethmoidal air cells. No significant mastoid effusion. Other: None. IMPRESSION: 1. No acute intracranial abnormality. 2. Minor chronic microvascular ischemic disease for age. 3. Few scattered chronic micro hemorrhages, likely hypertensive in nature. Electronically Signed   By: Morene Hoard M.D.   On: 12/01/2023 01:54   CT HEAD WO CONTRAST ( ) Result Date: 11/30/2023 EXAM: CT HEAD AND CERVICAL SPINE 11/30/2023 07:56:00 PM TECHNIQUE: CT of the head and cervical spine was performed without the administration of intravenous contrast. Multiplanar reformatted images are provided for review. Automated exposure control, iterative reconstruction, and/or weight based adjustment of the mA/kV was utilized to reduce the radiation dose to as low as reasonably achievable. COMPARISON: None available. CLINICAL HISTORY: Ataxia, cervical  trauma, head trauma, abnormal mental status, history of seizures. FINDINGS: CT HEAD BRAIN AND  VENTRICLES: There are early changes of cerebral atrophy and small vessel disease. The cerebellum and brainstem are unremarkable. No cortical-based acute infarct, hemorrhage, or mass effect noted. No abnormal extra-axial fluid collection. The ventricles are normal in size and position. There are no hyperdense vessels or unexpected vascular calcification. ORBITS: No acute abnormality. SINUSES AND MASTOIDS: No acute abnormality. SOFT TISSUES AND SKULL: There are cutaneous calcifications in the frontotemporal scalp. No acute skull fracture. CT CERVICAL SPINE BONES AND ALIGNMENT: There is a straightened cervical lordosis. No listhesis is seen, and no widening of the anterior atlantodental joint. No acute fracture. DEGENERATIVE CHANGES: The discs are degenerated with bidirectional endplate osteophytes from C3-C4 through C6-C7. Posterior osteophytes are causing spinal stenosis and mild cord compression eccentric to the right at these levels. The C2-C3 and C7-T1 discs are normal in height. There are multilevel mild facet joint and uncinate spurring changes. Moderate foraminal stenosis is present at C5-C6. At C7-T1, there is moderate to severe right and moderate left foraminal stenosis. The other foramina are patent. SOFT TISSUES: No prevertebral soft tissue swelling. LUNGS: There are mild paraseptal emphysematous changes in the right lung apex. IMPRESSION: 1. No acute intracranial CT findings or depressed skull fractures. 2. Early cerebral atrophy and small vessel disease. 3. Cervical spondylosis with posterior osteophytes causing mild cord compression at C3-C4 through C6-C7. 4. there is moderate foraminal stenosis at C5-C6, and moderate to severe right and moderate left foraminal stenosis at C7-T1. 5. Mild paraseptal emphysema at the right lung apex. Electronically signed by: Francis Quam MD 11/30/2023 08:15 PM EST RP Workstation: [id number]   CT Cervical Spine Wo Contrast Result Date: 11/30/2023 EXAM: CT HEAD AND  CERVICAL SPINE 11/30/2023 07:56:00 PM TECHNIQUE: CT of the head and cervical spine was performed without the administration of intravenous contrast. Multiplanar reformatted images are provided for review. Automated exposure control, iterative reconstruction, and/or weight based adjustment of the mA/kV was utilized to reduce the radiation dose to as low as reasonably achievable. COMPARISON: None available. CLINICAL HISTORY: Ataxia, cervical trauma, head trauma, abnormal mental status, history of seizures. FINDINGS: CT HEAD BRAIN AND VENTRICLES: There are early changes of cerebral atrophy and small vessel disease. The cerebellum and brainstem are unremarkable. No cortical-based acute infarct, hemorrhage, or mass effect noted. No abnormal extra-axial fluid collection. The ventricles are normal in size and position. There are no hyperdense vessels or unexpected vascular calcification. ORBITS: No acute abnormality. SINUSES AND MASTOIDS: No acute abnormality. SOFT TISSUES AND SKULL: There are cutaneous calcifications in the frontotemporal scalp. No acute skull fracture. CT CERVICAL SPINE BONES AND ALIGNMENT: There is a straightened cervical lordosis. No listhesis is seen, and no widening of the anterior atlantodental joint. No acute fracture. DEGENERATIVE CHANGES: The discs are degenerated with bidirectional endplate osteophytes from C3-C4 through C6-C7. Posterior osteophytes are causing spinal stenosis and mild cord compression eccentric to the right at these levels. The C2-C3 and C7-T1 discs are normal in height. There are multilevel mild facet joint and uncinate spurring changes. Moderate foraminal stenosis is present at C5-C6. At C7-T1, there is moderate to severe right and moderate left foraminal stenosis. The other foramina are patent. SOFT TISSUES: No prevertebral soft tissue swelling. LUNGS: There are mild paraseptal emphysematous changes in the right lung apex. IMPRESSION: 1. No acute intracranial CT findings or  depressed skull fractures. 2. Early cerebral atrophy and small vessel disease. 3. Cervical spondylosis with posterior osteophytes causing mild cord  compression at C3-C4 through C6-C7. 4. there is moderate foraminal stenosis at C5-C6, and moderate to severe right and moderate left foraminal stenosis at C7-T1. 5. Mild paraseptal emphysema at the right lung apex. Electronically signed by: Francis Quam MD 11/30/2023 08:15 PM EST RP Workstation: [id number]    Microbiology: Results for orders placed or performed in visit on 05/20/22  Microscopic Examination     Status: None   Collection Time: 05/20/22  4:08 PM   Urine  Result Value Ref Range Status   WBC, UA None seen 0 - 5 /hpf Final   RBC, Urine 0-2 0 - 2 /hpf Final   Epithelial Cells (non renal) None seen 0 - 10 /hpf Final   Casts None seen None seen /lpf Final   Bacteria, UA None seen None seen/Few Final    Labs: CBC: Recent Labs  Lab 11/30/23 2028  WBC 4.3  NEUTROABS 2.2  HGB 15.0  HCT 44.4  MCV 87.7  PLT 165   Basic Metabolic Panel: Recent Labs  Lab 11/30/23 2028  NA 136  K 4.5  CL 106  CO2 20*  GLUCOSE 101*  BUN 15  CREATININE 1.60*  CALCIUM 8.4*   Liver Function Tests: Recent Labs  Lab 11/30/23 2028  AST 33  ALT 10  ALKPHOS 32*  BILITOT 1.1  PROT 6.6  ALBUMIN 3.6   CBG: No results for input(s): GLUCAP in the last 168 hours.  Discharge time spent: greater than 30 minutes.  Signed: Burnard DELENA Cunning, DO [facility] 12/01/2023

## 2023-12-01 NOTE — Procedures (Signed)
 Routine EEG Report  Ricky Ross is a 53 y.o. male with a history of seizure who is undergoing an EEG to evaluate for seizures.  Report: This EEG was acquired with electrodes placed according to the International 10-20 electrode system (including Fp1, Fp2, F3, F4, C3, C4, P3, P4, O1, O2, T3, T4, T5, T6, A1, A2, Fz, Cz, Pz). The following electrodes were missing or displaced: none.  The occipital dominant rhythm was 9 Hz. This activity is reactive to stimulation. Drowsiness was manifested by background fragmentation; deeper stages of sleep were identified by K complexes and sleep spindles. There was no focal slowing. There were rare sharp waves over the left frontotemporal region. There were no electrographic seizures identified. There was no abnormal response to photic stimulation or hyperventilation.   Impression and clinical correlation: This EEG was obtained while awake and asleep and is abnormal due to rare interictal discharges over the left frontotemporal region indicating increased epileptogenic potential in this area.  [name], MD [facility] [phone]  If 7pm- 7am, please page neurology on call as listed in AMION.

## 2023-12-01 NOTE — Assessment & Plan Note (Signed)
-   This is likely prerenal. - Will hydrate the patient with IV normal saline and follow BMP. - Will follow BMP and avoid nephrotoxins.

## 2024-05-24 ENCOUNTER — Encounter: Admitting: Physician Assistant
